# Patient Record
Sex: Male | Born: 1975 | Race: Black or African American | Hispanic: No | Marital: Married | Smoking: Never smoker
Health system: Southern US, Community
[De-identification: ages and names within clinical notes are randomized; demographics above are authoritative.]

## PROBLEM LIST (undated history)

## (undated) DIAGNOSIS — G4733 Obstructive sleep apnea (adult) (pediatric): Secondary | ICD-10-CM

## (undated) DIAGNOSIS — K760 Fatty (change of) liver, not elsewhere classified: Secondary | ICD-10-CM

## (undated) DIAGNOSIS — K219 Gastro-esophageal reflux disease without esophagitis: Secondary | ICD-10-CM

## (undated) DIAGNOSIS — K449 Diaphragmatic hernia without obstruction or gangrene: Secondary | ICD-10-CM

## (undated) HISTORY — DX: Diaphragmatic hernia without obstruction or gangrene: K44.9

## (undated) HISTORY — DX: Obstructive sleep apnea (adult) (pediatric): G47.33

## (undated) HISTORY — DX: Gastro-esophageal reflux disease without esophagitis: K21.9

## (undated) HISTORY — PX: OTHER SURGICAL HISTORY: SHX169

## (undated) HISTORY — DX: Fatty (change of) liver, not elsewhere classified: K76.0

---

## 2012-07-16 ENCOUNTER — Other Ambulatory Visit: Payer: Self-pay | Admitting: Internal Medicine

## 2012-07-16 LAB — BASIC METABOLIC PANEL
Anion Gap: 9 (ref 7–16)
BUN: 13 mg/dL (ref 7–18)
Calcium, Total: 8.8 mg/dL (ref 8.5–10.1)
Chloride: 111 mmol/L — ABNORMAL HIGH (ref 98–107)
Co2: 23 mmol/L (ref 21–32)
EGFR (African American): >60
EGFR (Non-African Amer.): >60

## 2012-07-16 LAB — URINALYSIS, COMPLETE
Bacteria: NONE SEEN
Glucose,UR: NEGATIVE mg/dL (ref 0–75)
Leukocyte Esterase: NEGATIVE
Nitrite: NEGATIVE
Protein: NEGATIVE
RBC,UR: <1 /HPF (ref 0–5)
Specific Gravity: 1.021 (ref 1.003–1.030)
Squamous Epithelial: NONE SEEN
WBC UR: <1 /HPF (ref 0–5)

## 2012-07-16 LAB — HEPATIC FUNCTION PANEL A (ARMC)
Albumin: 4.1 g/dL (ref 3.4–5.0)
Alkaline Phosphatase: 87 U/L (ref 50–136)
Bilirubin, Direct: <0.1 mg/dL (ref 0.00–0.20)
Bilirubin,Total: 0.2 mg/dL (ref 0.2–1.0)
SGOT(AST): 37 U/L (ref 15–37)
SGPT (ALT): 91 U/L — ABNORMAL HIGH (ref 12–78)

## 2012-07-17 LAB — URINE CULTURE

## 2013-05-31 ENCOUNTER — Other Ambulatory Visit: Payer: Self-pay | Admitting: Internal Medicine

## 2013-05-31 LAB — HEPATIC FUNCTION PANEL A (ARMC)
Alkaline Phosphatase: 77 U/L (ref 50–136)
Bilirubin, Direct: 0.2 mg/dL (ref 0.00–0.20)
SGOT(AST): 32 U/L (ref 15–37)

## 2014-06-12 ENCOUNTER — Telehealth: Payer: Self-pay | Admitting: Internal Medicine

## 2014-06-12 ENCOUNTER — Ambulatory Visit (HOSPITAL_COMMUNITY)
Admission: RE | Admit: 2014-06-12 | Discharge: 2014-06-12 | Disposition: A | Discharge status: Home / Self Care | Payer: 59 | Source: Ambulatory Visit | Attending: Internal Medicine | Admitting: Internal Medicine

## 2014-06-12 ENCOUNTER — Ambulatory Visit: Payer: 59 | Attending: Internal Medicine | Admitting: Internal Medicine

## 2014-06-12 DIAGNOSIS — M545 Low back pain: Secondary | ICD-10-CM | POA: Insufficient documentation

## 2014-06-12 DIAGNOSIS — M5441 Lumbago with sciatica, right side: Secondary | ICD-10-CM

## 2014-06-12 LAB — CBC WITH DIFFERENTIAL/PLATELET
BASOS PCT: 1 % (ref 0–1)
Basophils Absolute: 0.1 10*3/uL (ref 0.0–0.1)
EOS ABS: 0.1 10*3/uL (ref 0.0–0.7)
Eosinophils Relative: 2 % (ref 0–5)
HCT: 43.6 % (ref 39.0–52.0)
HEMOGLOBIN: 15.9 g/dL (ref 13.0–17.0)
Lymphocytes Relative: 31 % (ref 12–46)
Lymphs Abs: 2.3 10*3/uL (ref 0.7–4.0)
MCH: 29.8 pg (ref 26.0–34.0)
MCHC: 36.5 g/dL — AB (ref 30.0–36.0)
MCV: 81.8 fL (ref 78.0–100.0)
MONO ABS: 0.4 10*3/uL (ref 0.1–1.0)
MONOS PCT: 6 % (ref 3–12)
MPV: 10.9 fL (ref 9.4–12.4)
NEUTROS PCT: 60 % (ref 43–77)
Neutro Abs: 4.4 10*3/uL (ref 1.7–7.7)
Platelets: 227 10*3/uL (ref 150–400)
RBC: 5.33 MIL/uL (ref 4.22–5.81)
RDW: 13.8 % (ref 11.5–15.5)
WBC: 7.4 10*3/uL (ref 4.0–10.5)

## 2014-06-12 LAB — LIPID PANEL
CHOLESTEROL: 187 mg/dL (ref 0–200)
HDL: 43 mg/dL (ref >39)
LDL Cholesterol: 121 mg/dL — ABNORMAL HIGH (ref 0–99)
TRIGLYCERIDES: 116 mg/dL (ref <150)
Total CHOL/HDL Ratio: 4.3 Ratio
VLDL: 23 mg/dL (ref 0–40)

## 2014-06-12 LAB — COMPLETE METABOLIC PANEL WITH GFR
ALT: 44 U/L (ref 0–53)
AST: 25 U/L (ref 0–37)
Albumin: 4.5 g/dL (ref 3.5–5.2)
Alkaline Phosphatase: 66 U/L (ref 39–117)
BILIRUBIN TOTAL: 0.7 mg/dL (ref 0.2–1.2)
BUN: 14 mg/dL (ref 6–23)
CO2: 31 mEq/L (ref 19–32)
CREATININE: 0.84 mg/dL (ref 0.50–1.35)
Calcium: 10 mg/dL (ref 8.4–10.5)
Chloride: 103 mEq/L (ref 96–112)
GFR, Est African American: >89 mL/min
GLUCOSE: 100 mg/dL — AB (ref 70–99)
Potassium: 4.9 mEq/L (ref 3.5–5.3)
Sodium: 142 mEq/L (ref 135–145)
Total Protein: 7.2 g/dL (ref 6.0–8.3)

## 2014-06-12 LAB — POCT GLYCOSYLATED HEMOGLOBIN (HGB A1C): HEMOGLOBIN A1C: 5.6

## 2014-06-12 LAB — TSH: TSH: 1.048 u[IU]/mL (ref 0.350–4.500)

## 2014-06-12 NOTE — Progress Notes (Signed)
Patient ID: Charles BienenstockDawood Bibb, male   DOB: 04/17/1976, 38 y.o.   MRN: [id number]   Charles BienenstockDawood Vosler, is a 38 y.o. male  WUJ:[id number]  NFA:[id number]  DOB - 11/29/1975  CC: No chief complaint on file.      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 He has no significant past medical history. Major complaint today is low back pain radiating to both hips and thighs for the past 4 months, worse in the last 2 weeks and start to wake him up from sleep. He refused his pain is about 3 out of 10 but quite intense at night. No known aggravating or relieving factors. He has no past history of trauma, or fall. He denies any fever or drenching night sweats. He does not smoke cigarettes, does not drink alcohol. No personal history of hypertension or diabetes. No significant family history. Not on any medication. Patient has No headache, No chest pain, No abdominal pain - No Nausea, No new weakness tingling or numbness, No Cough - SOB.  Allergies not on file No past medical history on file. No current outpatient prescriptions on file prior to visit.   No current facility-administered medications on file prior to visit.   No family history on file. History   Social History  . Marital Status: Married    Spouse Name: N/A    Number of Children: N/A  . Years of Education: N/A   Occupational History  . Not on file.   Social History Main Topics  . Smoking status: Not on file  . Smokeless tobacco: Not on file  . Alcohol Use: Not on file  . Drug Use: Not on file  . Sexual Activity: Not on file   Other Topics Concern  . Not on file   Social History Narrative  . No narrative on file    Review of Systems: BP 138/84 mm Hg, HR: 87 Constitutional: Negative for fever, chills, diaphoresis, activity change, appetite change and fatigue. HENT: Negative for ear pain, nosebleeds, congestion, facial swelling, rhinorrhea, neck pain, neck stiffness and ear  discharge.  Eyes: Negative for pain, discharge, redness, itching and visual disturbance. Respiratory: Negative for cough, choking, chest tightness, shortness of breath, wheezing and stridor.  Cardiovascular: Negative for chest pain, palpitations and leg swelling. Gastrointestinal: Negative for abdominal distention. Genitourinary: Negative for dysuria, urgency, frequency, hematuria, flank pain, decreased urine volume, difficulty urinating and dyspareunia.  Musculoskeletal: Positive for back pain, negative for joint swelling, arthralgia and gait problem. Neurological: Negative for dizziness, tremors, seizures, syncope, facial asymmetry, speech difficulty, weakness, light-headedness, numbness and headaches.  Hematological: Negative for adenopathy. Does not bruise/bleed easily. Psychiatric/Behavioral: Negative for hallucinations, behavioral problems, confusion, dysphoric mood, decreased concentration and agitation.    Objective:  There were no vitals filed for this visit.  Physical Exam: Constitutional: Patient appears well-developed and well-nourished. No distress. HENT: Normocephalic, atraumatic, External right and left ear normal. Oropharynx is clear and moist.  Eyes: Conjunctivae and EOM are normal. PERRLA, no scleral icterus. Neck: Normal ROM. Neck supple. No JVD. No tracheal deviation. No thyromegaly. CVS: RRR, S1/S2 +, no murmurs, no gallops, no carotid bruit.  Pulmonary: Effort and breath sounds normal, no stridor, rhonchi, wheezes, rales.  Abdominal: Soft. BS +, no distension, tenderness, rebound or guarding.  Musculoskeletal: Normal range of motion. No edema and no tenderness.  Lymphadenopathy: No lymphadenopathy noted, cervical, inguinal or axillary Neuro: Alert. Normal reflexes, muscle tone coordination. No cranial nerve deficit. Skin: Skin is warm  and dry. No rash noted. Not diaphoretic. No erythema. No pallor. Psychiatric: Normal mood and affect. Behavior, judgment, thought  content normal.  No results found for: WBC, HGB, HCT, MCV, PLT No results found for: CREATININE, BUN, NA, K, CL, CO2  No results found for: HGBA1C Lipid Panel  No results found for: CHOL, TRIG, HDL, CHOLHDL, VLDL, LDLCALC     Assessment and plan:   1. Midline low back pain with right-sided sciatica  - DG Lumbar Spine Complete; Future - DG Hip Bilateral W/Pelvis; Future - CBC with Differential - COMPLETE METABOLIC PANEL WITH GFR - POCT glycosylated hemoglobin (Hb A1C) - Lipid panel - TSH - Urinalysis, Complete   Lumbar x-ray report shows: IMPRESSION: 1. 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 The imaging appearance on these radiographs is nonspecific and ranges from aneurysmal bone cyst to telangiectatic osteosarcoma. Lytic metastasis, myeloma or lymphoma are also in the differential considerations. Followup MRI of the pelvis with and without contrast is recommended. A noncontrast CT will probably also be useful but can be obtained after MRI. 2. Normal appearance of the lumbar spine.  An MRI pelvis with and without contrast has been ordered, subsequent follow up will depend on results of MRI.  Patient was extensively counseled about nutrition and exercise.  Return in about 6 months (around 12/11/2014), or if symptoms worsen or fail to improve, for Follow up Pain and comorbidities.  The patient was given clear instructions to go to ER or return to medical center if symptoms don't improve, worsen or new problems develop. The patient verbalized understanding. The patient was told to call to get lab results if they haven't heard anything in the next week.     This note has been created with Education officer, environmentalDragon speech recognition software and smart phrase technology. Any transcriptional errors are unintentional.    Jeanann LewandowskyJEGEDE, Raynesha Tiedt, MD, MHA, FACP, FAAP [facility] [place], [place] [phone]   06/12/2014, 10:51 AM

## 2014-06-12 NOTE — Telephone Encounter (Signed)
[facility] needing an "MRI Pelvis with out and with" order to be placed.

## 2014-06-12 NOTE — Patient Instructions (Signed)
Back Pain, Adult Low back pain is very common. About 1 in 5 people have back pain.The cause of low back pain is rarely dangerous. The pain often gets better over time.About half of people with a sudden onset of back pain feel better in just 2 weeks. About 8 in 10 people feel better by 6 weeks.  CAUSES Some common causes of back pain include:  Strain of the muscles or ligaments supporting the spine.  Wear and tear (degeneration) of the spinal discs.  Arthritis.  Direct injury to the back. DIAGNOSIS Most of the time, the direct cause of low back pain is not known.However, back pain can be treated effectively even when the exact cause of the pain is unknown.Answering your caregiver's questions about your overall health and symptoms is one of the most accurate ways to make sure the cause of your pain is not dangerous. If your caregiver needs more information, he or she may order lab work or imaging tests (X-rays or MRIs).However, even if imaging tests show changes in your back, this usually does not require surgery. HOME CARE INSTRUCTIONS For many people, back pain returns.Since low back pain is rarely dangerous, it is often a condition that people can learn to manageon their own.   Remain active. It is stressful on the back to sit or stand in one place. Do not sit, drive, or stand in one place for more than 30 minutes at a time. Take short walks on level surfaces as soon as pain allows.Try to increase the length of time you walk each day.  Do not stay in bed.Resting more than 1 or 2 days can delay your recovery.  Do not avoid exercise or work.Your body is made to move.It is not dangerous to be active, even though your back may hurt.Your back will likely heal faster if you return to being active before your pain is gone.  Pay attention to your body when you bend and lift. Many people have less discomfortwhen lifting if they bend their knees, keep the load close to their bodies,and  avoid twisting. Often, the most comfortable positions are those that put less stress on your recovering back.  Find a comfortable position to sleep. Use a firm mattress and lie on your side with your knees slightly bent. If you lie on your back, put a pillow under your knees.  Only take over-the-counter or prescription medicines as directed by your caregiver. Over-the-counter medicines to reduce pain and inflammation are often the most helpful.Your caregiver may prescribe muscle relaxant drugs.These medicines help dull your pain so you can more quickly return to your normal activities and healthy exercise.  Put ice on the injured area.  Put ice in a plastic bag.  Place a towel between your skin and the bag.  Leave the ice on for 15-20 minutes, 03-04 times a day for the first 2 to 3 days. After that, ice and heat may be alternated to reduce pain and spasms.  Ask your caregiver about trying back exercises and gentle massage. This may be of some benefit.  Avoid feeling anxious or stressed.Stress increases muscle tension and can worsen back pain.It is important to recognize when you are anxious or stressed and learn ways to manage it.Exercise is a great option. SEEK MEDICAL CARE IF:  You have pain that is not relieved with rest or medicine.  You have pain that does not improve in 1 week.  You have new symptoms.  You are generally not feeling well. SEEK   IMMEDIATE MEDICAL CARE IF:   You have pain that radiates from your back into your legs.  You develop new bowel or bladder control problems.  You have unusual weakness or numbness in your arms or legs.  You develop nausea or vomiting.  You develop abdominal pain.  You feel faint. Document Released: 07/07/2005 Document Revised: 01/06/2012 Document Reviewed: 11/08/2013 ExitCare Patient Information 2015 [employer]. This information is not intended to replace advice given to you by your health care provider. Make sure you  discuss any questions you have with your health care provider.  

## 2014-06-13 ENCOUNTER — Ambulatory Visit
Admission: RE | Admit: 2014-06-13 | Discharge: 2014-06-13 | Disposition: A | Discharge status: Home / Self Care | Payer: 59 | Source: Ambulatory Visit | Attending: Internal Medicine | Admitting: Internal Medicine

## 2014-06-13 DIAGNOSIS — M5441 Lumbago with sciatica, right side: Secondary | ICD-10-CM

## 2014-06-13 LAB — URINALYSIS, COMPLETE
BILIRUBIN URINE: NEGATIVE
Bacteria, UA: NONE SEEN
Casts: NONE SEEN
Crystals: NONE SEEN
GLUCOSE, UA: NEGATIVE mg/dL
Hgb urine dipstick: NEGATIVE
Ketones, ur: NEGATIVE mg/dL
Leukocytes, UA: NEGATIVE
Nitrite: NEGATIVE
Protein, ur: NEGATIVE mg/dL
SPECIFIC GRAVITY, URINE: 1.021 (ref 1.005–1.030)
SQUAMOUS EPITHELIAL / LPF: NONE SEEN
UROBILINOGEN UA: 0.2 mg/dL (ref 0.0–1.0)
pH: 6.5 (ref 5.0–8.0)

## 2014-06-13 MED ORDER — GADOBENATE DIMEGLUMINE 529 MG/ML IV SOLN
20.0000 mL | Freq: Once | INTRAVENOUS | Status: AC | PRN
  Administered 2014-06-13: 20 mL via INTRAVENOUS

## 2014-06-19 ENCOUNTER — Other Ambulatory Visit (HOSPITAL_COMMUNITY): Payer: Self-pay | Admitting: Orthopedic Surgery

## 2014-06-19 ENCOUNTER — Ambulatory Visit (HOSPITAL_COMMUNITY)
Admission: RE | Admit: 2014-06-19 | Discharge: 2014-06-19 | Disposition: A | Discharge status: Home / Self Care | Payer: 59 | Source: Ambulatory Visit | Attending: Orthopedic Surgery | Admitting: Orthopedic Surgery

## 2014-06-19 DIAGNOSIS — M898X5 Other specified disorders of bone, thigh: Secondary | ICD-10-CM

## 2014-06-19 DIAGNOSIS — M898X8 Other specified disorders of bone, other site: Secondary | ICD-10-CM | POA: Insufficient documentation

## 2014-06-19 MED ORDER — TECHNETIUM TC 99M MEDRONATE IV KIT
25.0000 | PACK | Freq: Once | INTRAVENOUS | Status: AC | PRN
  Administered 2014-06-19: 25 via INTRAVENOUS

## 2014-06-22 ENCOUNTER — Ambulatory Visit (HOSPITAL_COMMUNITY): Payer: 59

## 2014-06-27 ENCOUNTER — Telehealth: Payer: Self-pay | Admitting: Emergency Medicine

## 2014-06-27 NOTE — Telephone Encounter (Signed)
Pt already aware of MRI results

## 2014-06-27 NOTE — Telephone Encounter (Signed)
-----   Message from Quentin Angstlugbemiga E Jegede, MD sent at 06/13/2014 11:35 AM EST ----- Patient has been given the result of MRI, he will be referred to orthopedic surgeon for further evaluation and possible management.

## 2014-07-27 ENCOUNTER — Ambulatory Visit: Payer: 59 | Attending: Internal Medicine | Admitting: Internal Medicine

## 2014-07-27 DIAGNOSIS — R110 Nausea: Secondary | ICD-10-CM

## 2014-07-27 MED ORDER — PANTOPRAZOLE SODIUM 40 MG PO TBEC: 40.0000 mg | DELAYED_RELEASE_TABLET | Freq: Every day | ORAL | Status: DC

## 2014-07-27 MED ORDER — ONDANSETRON HCL 4 MG PO TABS: 4.0000 mg | ORAL_TABLET | Freq: Three times a day (TID) | ORAL | Status: DC | PRN

## 2014-07-27 NOTE — Patient Instructions (Signed)

## 2014-07-27 NOTE — Progress Notes (Signed)
Patient ID: Charles David, male   DOB: 05/29/1976, 39 y.o.   MRN: [id number]   Charles David, is a 39 y.o. male  WUJ:[id number]  NFA:[id number]  DOB - 12/18/1975  No chief complaint on file.       Subjective:   Charles David is a 39 y.o. male here today for a follow up visit. Patient has no significant past medical history, recently investigated for low back pain, initial x-ray showed expansile cystic lesion in the medial right iliac bone, patient has probable pelvic MRI, bone scan and biopsy, he has been advised close follow-up as no significant finding so far. Patient is here today with complaint of abdominal pain, intense nausea but no vomiting. He requests abdominal ultrasound to evaluate for gallstone. No change in bowel habit. No abdominal distention. Patient has No headache, No chest pain, No new weakness tingling or numbness, No Cough - SOB.  Problem  Nausea Without Vomiting    ALLERGIES: Allergies not on file  PAST MEDICAL HISTORY: No past medical history on file.  MEDICATIONS AT HOME: Prior to Admission medications   Medication Sig Start Date End Date Taking? Authorizing Provider  ondansetron (ZOFRAN) 4 MG tablet Take 1 tablet (4 mg total) by mouth every 8 (eight) hours as needed for nausea or vomiting. 07/27/14   Quentin Angstlugbemiga E Lashonta Pilling, MD  pantoprazole (PROTONIX) 40 MG tablet Take 1 tablet (40 mg total) by mouth daily. 07/27/14   Quentin Angstlugbemiga E Rayen Dafoe, MD     Objective:   There were no vitals filed for this visit.  Exam General appearance : Awake, alert, not in any distress. Speech Clear. Not toxic looking HEENT: Atraumatic and Normocephalic, pupils equally reactive to light and accomodation Neck: supple, no JVD. No cervical lymphadenopathy.  Chest:Good air entry bilaterally, no added sounds  CVS: S1 S2 regular, no murmurs.  Abdomen: Bowel sounds present, Non tender and not distended with no gaurding, rigidity or rebound. Extremities: B/L Lower Ext shows no  edema, both legs are warm to touch Neurology: Awake alert, and oriented X 3, CN II-XII intact, Non focal Skin:No Rash Wounds:N/A  Data Review Lab Results  Component Value Date   HGBA1C 5.6 06/12/2014     Assessment & Plan   1. Nausea without vomiting  - US Abdomen Limited RUQ; Future  - ondansetron (ZOFRAN) 4 MG tablet; Take 1 tablet (4 mg total) by mouth every 8 (eight) hours as needed for nausea or vomiting.  Dispense: 30 tablet; Refill: 0 - pantoprazole (PROTONIX) 40 MG tablet; Take 1 tablet (40 mg total) by mouth daily.  Dispense: 90 tablet; Refill: 3  Patient was counseled extensively on nutrition and exercise.  Return in about 6 months (around 01/25/2015), or if symptoms worsen or fail to improve, for Follow up Pain and comorbidities.  The patient was given clear instructions to go to ER or return to medical center if symptoms don't improve, worsen or new problems develop. The patient verbalized understanding. The patient was told to call to get lab results if they haven't heard anything in the next week.   This note has been created with Education officer, environmentalDragon speech recognition software and smart phrase technology. Any transcriptional errors are unintentional.    Jeanann LewandowskyJEGEDE, Aariana Shankland, MD, MHA, FACP, FAAP [facility] [place], [place] [phone]   07/27/2014, 4:22 PM

## 2014-07-28 ENCOUNTER — Other Ambulatory Visit: Payer: Self-pay | Admitting: Pulmonary Disease

## 2014-07-28 ENCOUNTER — Encounter: Payer: Self-pay | Admitting: Pulmonary Disease

## 2014-07-28 DIAGNOSIS — R0683 Snoring: Secondary | ICD-10-CM | POA: Insufficient documentation

## 2014-07-28 NOTE — Progress Notes (Signed)
Chief Complaint  Patient presents with  . Sleeping Problem    History of Present Illness: Charles BienenstockDawood David is a 39 y.o. male for evaluation of sleep problems.  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  He has noticed feeling more sleepy during the day.  He has been told that he snores, and this has been getting worse.  His wife has also told him that he stops breathing while asleep.  He will wake up frequently feeling like he can't catch his breath, and this is worse when he sleeps on his back.  He goes to sleep at 10 pm.  He falls asleep quickly.  He wakes up occasionally to use the bathroom.  He gets out of bed at 6 am.  He feels tire in the morning.  He denies morning headache.  He does not use anything to help him fall sleep or stay awake.  He denies sleep walking, sleep talking, bruxism, or nightmares.  There is no history of restless legs.  He denies sleep hallucinations, sleep paralysis, or cataplexy.  The Epworth score is 14 out of 24.  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 Authorizing Provider  ondansetron (ZOFRAN) 4 MG tablet Take 1 tablet (4 mg total) by mouth every 8 (eight) hours as needed for nausea or vomiting. 07/27/14   Quentin Angstlugbemiga E Jegede, MD  pantoprazole (PROTONIX) 40 MG tablet Take 1 tablet (40 mg total) by mouth daily. 07/27/14   Quentin Angstlugbemiga E Jegede, MD    No Known Allergies  His denies significant family history.  He  reports that he has never smoked. He does not have any smokeless tobacco history on file. He reports that he does not drink alcohol.   Physical Exam: Blood pressure 135/85, pulse 95, temperature 98.7 F (37.1 C), temperature source Axillary, resp. rate 14, height 6\' 1"  (1.854 m), weight 300 lb (136.079 kg), SpO2 98 %. Body mass index is 39.59 kg/(m^2).  General - No distress ENT - No sinus tenderness, no oral exudate,  no LAN, no thyromegaly, TM clear, pupils equal/reactive, MP 3, enlarged tongue Cardiac - s1s2 regular, no murmur, pulses symmetric Chest - No wheeze/rales/dullness, good air entry, normal respiratory excursion Back - No focal tenderness Abd - Soft, non-tender, no organomegaly, + bowel sounds Ext - No edema Neuro - Normal strength, cranial nerves intact Skin - No rashes Psych - Normal mood, and behavior  Impression:  Snoring. Discussion: He reports snoring, sleep disruption, witnessed apnea, and daytime sleepiness.  I am concerned he coulud have sleep apnea.  His BMI is > 35.  We discussed how sleep apnea can affect various health problems including risks for hypertension, cardiovascular disease, and diabetes.  We also discussed how sleep disruption can increase risks for accident, such as while driving.  Weight loss as a means of improving sleep apnea was also reviewed.  Additional treatment options discussed were CPAP therapy, oral appliance, and surgical intervention. Plan: - will arrange for home sleep study to further assess for obstructive sleep apnea     Coralyn HellingVineet Karri Kallenbach, M.D. Pager [phone]

## 2014-07-28 NOTE — Patient Instructions (Signed)
Will arrange for home sleep study and call with results.

## 2014-07-31 ENCOUNTER — Ambulatory Visit (HOSPITAL_COMMUNITY)
Admission: RE | Admit: 2014-07-31 | Discharge: 2014-07-31 | Disposition: A | Discharge status: Home / Self Care | Payer: 59 | Source: Ambulatory Visit | Attending: Internal Medicine | Admitting: Internal Medicine

## 2014-07-31 DIAGNOSIS — R110 Nausea: Secondary | ICD-10-CM

## 2014-07-31 DIAGNOSIS — R112 Nausea with vomiting, unspecified: Secondary | ICD-10-CM | POA: Insufficient documentation

## 2014-08-02 ENCOUNTER — Ambulatory Visit (HOSPITAL_BASED_OUTPATIENT_CLINIC_OR_DEPARTMENT_OTHER): Payer: 59 | Attending: Pulmonary Disease | Admitting: *Deleted

## 2014-08-02 DIAGNOSIS — R0683 Snoring: Secondary | ICD-10-CM | POA: Diagnosis not present

## 2014-08-06 NOTE — Sleep Study (Signed)
[facility]  NAME: Charles David DATE OF BIRTH:  04/08/1976 MEDICAL RECORD NUMBER [id number]  LOCATION: [facility]  PHYSICIAN: Coralyn HellingVineet Drisana Schweickert, M.D. DATE OF STUDY: 08/02/2014  SLEEP STUDY TYPE: Home sleep study               REFERRING PHYSICIAN: Coralyn HellingSood, Geddy Boy[place]un, MD  INDICATION FOR STUDY:  Huey BienenstockDawood Moreland is a 39 y.o. male who presents to the sleep lab for evaluation of hypersomnia with obstructive sleep apnea.  He reports snoring, sleep disruption, apnea, and daytime sleepiness.  EPWORTH SLEEPINESS SCORE: 8. HEIGHT: 6\' 1"  WEIGHT: 300    BMI: 39.6   MEDICATIONS:  Current Outpatient Prescriptions on File Prior to Visit  Medication Sig Dispense Refill  . ondansetron (ZOFRAN) 4 MG tablet Take 1 tablet (4 mg total) by mouth every 8 (eight) hours as needed for nausea or vomiting. 30 tablet 0  . pantoprazole (PROTONIX) 40 MG tablet Take 1 tablet (40 mg total) by mouth daily. 90 tablet 3   No current facility-administered medications on file prior to visit.   Total recording time: 400.8 minutes  CARDIAC DATA:  Average heart rate: 82 beats per minute. Rhythm strip: sinus rhythm with brief period of sinus tachycardia.  RESPIRATORY DATA: Snoring: loud. Average AHI: 8.5.   Hypopnea index: 7.8. Obstructive apnea index: 0.6.  Central apnea index: 0.1.  Mixed apnea index: 0.  OXYGEN DATA:  Baseline oxygenation: 98%. Lowest SaO2: 90%. Time spent below SaO2 90%: 0 minutes.  IMPRESSION/ RECOMMENDATION:   This study shows mild sleep apnea with an AHI of 8.5 and SaO2 low of 90%.   Additional therapies include weight loss, CPAP, oral appliance, or surgical evaluation.   Coralyn HellingVineet Tayleigh Wetherell, M.D. Diplomate, Biomedical engineerAmerican Board of Sleep Medicine  ELECTRONICALLY SIGNED ON:  08/06/2014, 11:31 AM [facility] PH: [phone]   FX: [phone] ACCREDITED BY THE AMERICAN ACADEMY OF SLEEP MEDICINE

## 2014-08-09 ENCOUNTER — Telehealth: Payer: Self-pay | Admitting: Pulmonary Disease

## 2014-08-09 DIAGNOSIS — R0683 Snoring: Secondary | ICD-10-CM

## 2014-08-09 NOTE — Telephone Encounter (Signed)
HST 08/04/14 >> AHI 8.5.  D/w Dr. Payton MccallumEl gergawy.  Will have my nurse arrange for auto CPAP set up.  Please arrange for auto CPAP with range 5 to 15 cm H2O, heated humidity and mask of choice.  Please have download sent after 1 month of use.

## 2014-08-10 ENCOUNTER — Telehealth: Payer: Self-pay | Admitting: Emergency Medicine

## 2014-08-10 NOTE — Telephone Encounter (Signed)
Results have been explained to patient, pt expressed understanding. Order placed. Nothing further needed.  

## 2014-08-10 NOTE — Telephone Encounter (Signed)
Pt given ultrasound results with instructions to start regular exercise with low fat diet

## 2014-08-10 NOTE — Telephone Encounter (Signed)
-----   Message from Quentin Angstlugbemiga E Jegede, MD sent at 08/10/2014  9:15 AM EST ----- Please inform patient that his abdominal ultrasound shows evidence of fatty liver but no obvious mass, no gallstone detected. Advised patient to engage in regular exercise, stay away from alcohol and advised on low-cholesterol low-fat diet.

## 2014-08-24 ENCOUNTER — Other Ambulatory Visit (HOSPITAL_COMMUNITY): Payer: Self-pay | Admitting: Orthopedic Surgery

## 2014-08-24 DIAGNOSIS — M899 Disorder of bone, unspecified: Secondary | ICD-10-CM

## 2014-09-15 LAB — HM COLONOSCOPY

## 2014-09-18 ENCOUNTER — Ambulatory Visit (HOSPITAL_COMMUNITY)
Admission: RE | Admit: 2014-09-18 | Discharge: 2014-09-18 | Disposition: A | Discharge status: Home / Self Care | Payer: 59 | Source: Ambulatory Visit | Attending: Orthopedic Surgery | Admitting: Orthopedic Surgery

## 2014-09-18 ENCOUNTER — Other Ambulatory Visit: Payer: Self-pay | Admitting: Gastroenterology

## 2014-09-18 DIAGNOSIS — M899 Disorder of bone, unspecified: Secondary | ICD-10-CM | POA: Diagnosis present

## 2014-09-18 DIAGNOSIS — R110 Nausea: Secondary | ICD-10-CM

## 2014-09-18 MED ORDER — GADOBENATE DIMEGLUMINE 529 MG/ML IV SOLN
20.0000 mL | Freq: Once | INTRAVENOUS | Status: AC | PRN
  Administered 2014-09-18: 15 mL via INTRAVENOUS

## 2014-09-21 ENCOUNTER — Telehealth: Payer: Self-pay | Admitting: Pulmonary Disease

## 2014-09-21 NOTE — Telephone Encounter (Signed)
Auto CPAP 08/18/14 to 09/16/14 >> used on 9 of 30 nights with average 2 hrs and 5 min.  Average AHI is 0.2 with median CPAP 6 cm H2O and 95 th percentile CPAP 7 cm H20.   Will have my nurse inform Dr. Randol KernElgergawy that his CPAP download shows good control of sleep apnea when he is using his CPAP.

## 2014-09-21 NOTE — Telephone Encounter (Signed)
Results have been explained to patient, pt expressed understanding. Nothing further needed.  

## 2014-09-28 ENCOUNTER — Ambulatory Visit (HOSPITAL_COMMUNITY): Payer: 59

## 2014-10-11 ENCOUNTER — Ambulatory Visit (HOSPITAL_COMMUNITY)
Admission: RE | Admit: 2014-10-11 | Discharge: 2014-10-11 | Disposition: A | Discharge status: Home / Self Care | Payer: 59 | Source: Ambulatory Visit | Attending: Gastroenterology | Admitting: Gastroenterology

## 2014-10-11 ENCOUNTER — Encounter (HOSPITAL_COMMUNITY): Payer: Self-pay

## 2014-10-11 DIAGNOSIS — R110 Nausea: Secondary | ICD-10-CM | POA: Insufficient documentation

## 2014-10-11 MED ORDER — SINCALIDE 5 MCG IJ SOLR
0.0200 ug/kg | Freq: Once | INTRAMUSCULAR | Status: AC
  Administered 2014-10-11: 2 ug via INTRAVENOUS

## 2014-10-11 MED ORDER — TECHNETIUM TC 99M MEBROFENIN IV KIT
5.0000 | PACK | Freq: Once | INTRAVENOUS | Status: AC | PRN
  Administered 2014-10-11: 5 via INTRAVENOUS

## 2014-10-26 ENCOUNTER — Other Ambulatory Visit: Payer: Self-pay | Admitting: Gastroenterology

## 2014-10-26 DIAGNOSIS — R1084 Generalized abdominal pain: Secondary | ICD-10-CM

## 2014-10-26 DIAGNOSIS — R110 Nausea: Secondary | ICD-10-CM

## 2014-10-31 ENCOUNTER — Encounter (HOSPITAL_COMMUNITY): Payer: Self-pay

## 2014-10-31 ENCOUNTER — Ambulatory Visit (HOSPITAL_COMMUNITY)
Admission: RE | Admit: 2014-10-31 | Discharge: 2014-10-31 | Disposition: A | Discharge status: Home / Self Care | Payer: 59 | Source: Ambulatory Visit | Attending: Gastroenterology | Admitting: Gastroenterology

## 2014-10-31 DIAGNOSIS — R112 Nausea with vomiting, unspecified: Secondary | ICD-10-CM | POA: Diagnosis not present

## 2014-10-31 DIAGNOSIS — M899 Disorder of bone, unspecified: Secondary | ICD-10-CM | POA: Diagnosis not present

## 2014-10-31 DIAGNOSIS — K760 Fatty (change of) liver, not elsewhere classified: Secondary | ICD-10-CM | POA: Insufficient documentation

## 2014-10-31 DIAGNOSIS — R1084 Generalized abdominal pain: Secondary | ICD-10-CM | POA: Diagnosis not present

## 2014-10-31 DIAGNOSIS — R110 Nausea: Secondary | ICD-10-CM

## 2014-10-31 DIAGNOSIS — K449 Diaphragmatic hernia without obstruction or gangrene: Secondary | ICD-10-CM | POA: Diagnosis not present

## 2014-10-31 MED ORDER — IOHEXOL 300 MG/ML  SOLN
100.0000 mL | Freq: Once | INTRAMUSCULAR | Status: AC | PRN
  Administered 2014-10-31: 100 mL via INTRAVENOUS

## 2015-01-01 ENCOUNTER — Ambulatory Visit
Admission: RE | Admit: 2015-01-01 | Discharge: 2015-01-01 | Disposition: A | Discharge status: Home / Self Care | Payer: 59 | Source: Ambulatory Visit | Attending: Internal Medicine | Admitting: Internal Medicine

## 2015-01-01 ENCOUNTER — Encounter: Payer: Self-pay | Admitting: Internal Medicine

## 2015-01-01 ENCOUNTER — Ambulatory Visit (INDEPENDENT_AMBULATORY_CARE_PROVIDER_SITE_OTHER): Payer: 59 | Admitting: Internal Medicine

## 2015-01-01 VITALS — BP 150/95 | HR 84 | Temp 98.0°F | Wt 290.2 lb

## 2015-01-01 DIAGNOSIS — R05 Cough: Secondary | ICD-10-CM | POA: Diagnosis not present

## 2015-01-01 DIAGNOSIS — R059 Cough, unspecified: Secondary | ICD-10-CM | POA: Insufficient documentation

## 2015-01-01 DIAGNOSIS — R21 Rash and other nonspecific skin eruption: Secondary | ICD-10-CM | POA: Diagnosis not present

## 2015-01-01 DIAGNOSIS — G4733 Obstructive sleep apnea (adult) (pediatric): Secondary | ICD-10-CM | POA: Insufficient documentation

## 2015-01-01 LAB — COMPREHENSIVE METABOLIC PANEL
ALT: 102 U/L — ABNORMAL HIGH (ref 0–53)
AST: 63 U/L — ABNORMAL HIGH (ref 0–37)
Albumin: 4.4 g/dL (ref 3.5–5.2)
Alkaline Phosphatase: 81 U/L (ref 39–117)
BILIRUBIN TOTAL: 0.9 mg/dL (ref 0.2–1.2)
BUN: 13 mg/dL (ref 6–23)
CO2: 28 meq/L (ref 19–32)
Calcium: 9.6 mg/dL (ref 8.4–10.5)
Chloride: 103 mEq/L (ref 96–112)
Creat: 0.91 mg/dL (ref 0.50–1.35)
Glucose, Bld: 96 mg/dL (ref 70–99)
Potassium: 4.5 mEq/L (ref 3.5–5.3)
SODIUM: 140 meq/L (ref 135–145)
TOTAL PROTEIN: 7.2 g/dL (ref 6.0–8.3)

## 2015-01-01 MED ORDER — DOXYCYCLINE HYCLATE 50 MG PO CAPS: 50.0000 mg | ORAL_CAPSULE | Freq: Two times a day (BID) | ORAL | Status: DC

## 2015-01-01 NOTE — Progress Notes (Signed)
Patient ID: Charles David, male   DOB: Dec 07, 1975, 39 y.o.   MRN: [id number]         [facility] for Infectious Disease  Patient Active Problem List   Diagnosis Date Noted  . Rash 01/01/2015    Priority: High  . Cough 01/01/2015    Priority: High  . Hypersomnia with sleep apnea 01/01/2015  . Nausea without vomiting 07/27/2014  . Midline low back pain with right-sided sciatica 06/12/2014    Patient's Medications  New Prescriptions   DOXYCYCLINE (VIBRAMYCIN) 50 MG CAPSULE    Take 1 capsule (50 mg total) by mouth 2 (two) times daily.  Previous Medications   ONDANSETRON (ZOFRAN) 4 MG TABLET    Take 1 tablet (4 mg total) by mouth every 8 (eight) hours as needed for nausea or vomiting.   PANTOPRAZOLE (PROTONIX) 40 MG TABLET    Take 1 tablet (40 mg total) by mouth daily.  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 He returned from a trip to [place] 3 weeks ago. About 10 days ago he developed fatigue and low-grade fevers up to 99. 5 days ago he developed a rash on his thighs. He is also developed some dry cough and mild shortness of breath. The rash is only mildly pruritic. He has had no sick contacts that he is aware of. He's had no tick exposure or animal exposure.  Review of Systems: Constitutional: positive for fatigue and fevers, negative for anorexia, chills, sweats and weight loss Eyes: negative Ears, nose, mouth, throat, and face: negative Respiratory: positive for cough and shortness of breath, negative for pleurisy/chest pain, sputum and wheezing Cardiovascular: negative Gastrointestinal: negative Genitourinary:negative  No past medical history on file.  History  Substance Use Topics  . Smoking status: Never Smoker   . Smokeless tobacco: Not on file  . Alcohol Use: No    No family history on file.  No Known  Allergies  Objective: Temp: 98 F (36.7 C) (06/13 1145) Temp Source: Oral (06/13 1145) BP: 150/95 mmHg (06/13 1145) Pulse Rate: 84 (06/13 1145)  General: He is smiling and in no distress Skin: Erythematous maculopapular rash from his groin to his knees bilaterally. 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 He has not been in any hot tubs recently. I'm not sure how his rash correlates with the low-grade fevers and cough. I will treat him with doxycycline.  Plan: 1. Doxycycline 100 mg twice daily for 7 days 2. CBC with differential and complete metabolic panel 3. Chest x-ray   Cliffton Asters, MD [facility]ious [facility] [phone] pager   [phone] cell 01/01/2015, 12:45 PM

## 2015-01-02 LAB — CBC WITH DIFFERENTIAL/PLATELET
Basophils Absolute: 0.2 10*3/uL — ABNORMAL HIGH (ref 0.0–0.1)
Basophils Relative: 3 % — ABNORMAL HIGH (ref 0–1)
Eosinophils Absolute: 0.1 10*3/uL (ref 0.0–0.7)
Eosinophils Relative: 2 % (ref 0–5)
HEMATOCRIT: 42.2 % (ref 39.0–52.0)
HEMOGLOBIN: 14.3 g/dL (ref 13.0–17.0)
LYMPHS ABS: 3.2 10*3/uL (ref 0.7–4.0)
Lymphocytes Relative: 46 % (ref 12–46)
MCH: 28.9 pg (ref 26.0–34.0)
MCHC: 33.9 g/dL (ref 30.0–36.0)
MCV: 85.3 fL (ref 78.0–100.0)
MONOS PCT: 11 % (ref 3–12)
MPV: 10.5 fL (ref 8.6–12.4)
Monocytes Absolute: 0.8 10*3/uL (ref 0.1–1.0)
NEUTROS ABS: 2.7 10*3/uL (ref 1.7–7.7)
NEUTROS PCT: 38 % — AB (ref 43–77)
Platelets: 184 10*3/uL (ref 150–400)
RBC: 4.95 MIL/uL (ref 4.22–5.81)
RDW: 13.7 % (ref 11.5–15.5)
WBC: 7 10*3/uL (ref 4.0–10.5)

## 2015-01-04 ENCOUNTER — Telehealth: Payer: Self-pay | Admitting: Internal Medicine

## 2015-01-04 NOTE — Telephone Encounter (Signed)
I spoke to him by phone this morning. He is feeling better since starting doxycycline. His fevers have resolved and his cough and rash are improving. He will finish up his doxycycline.

## 2015-04-13 ENCOUNTER — Other Ambulatory Visit (HOSPITAL_COMMUNITY): Payer: Self-pay | Admitting: Orthopedic Surgery

## 2015-04-13 DIAGNOSIS — M899 Disorder of bone, unspecified: Secondary | ICD-10-CM

## 2015-04-16 ENCOUNTER — Ambulatory Visit (HOSPITAL_COMMUNITY): Payer: 59

## 2015-04-23 ENCOUNTER — Other Ambulatory Visit (HOSPITAL_COMMUNITY)
Admission: RE | Admit: 2015-04-23 | Discharge: 2015-04-23 | Disposition: A | Discharge status: Home / Self Care | Payer: 59 | Source: Ambulatory Visit | Attending: Internal Medicine | Admitting: Internal Medicine

## 2015-04-23 DIAGNOSIS — Z029 Encounter for administrative examinations, unspecified: Secondary | ICD-10-CM | POA: Diagnosis present

## 2015-04-23 LAB — CBC
HEMATOCRIT: 42.5 % (ref 39.0–52.0)
HEMOGLOBIN: 14.5 g/dL (ref 13.0–17.0)
MCH: 28.5 pg (ref 26.0–34.0)
MCHC: 34.1 g/dL (ref 30.0–36.0)
MCV: 83.5 fL (ref 78.0–100.0)
Platelets: 202 10*3/uL (ref 150–400)
RBC: 5.09 MIL/uL (ref 4.22–5.81)
RDW: 12.9 % (ref 11.5–15.5)
WBC: 9.3 10*3/uL (ref 4.0–10.5)

## 2015-04-23 LAB — COMPREHENSIVE METABOLIC PANEL
ALK PHOS: 65 U/L (ref 38–126)
ALT: 26 U/L (ref 17–63)
ANION GAP: 9 (ref 5–15)
AST: 19 U/L (ref 15–41)
Albumin: 4.4 g/dL (ref 3.5–5.0)
BILIRUBIN TOTAL: 0.8 mg/dL (ref 0.3–1.2)
BUN: 21 mg/dL — ABNORMAL HIGH (ref 6–20)
CALCIUM: 9.4 mg/dL (ref 8.9–10.3)
CO2: 29 mmol/L (ref 22–32)
CREATININE: 1.11 mg/dL (ref 0.61–1.24)
Chloride: 100 mmol/L — ABNORMAL LOW (ref 101–111)
Glucose, Bld: 92 mg/dL (ref 65–99)
Potassium: 3.7 mmol/L (ref 3.5–5.1)
Sodium: 138 mmol/L (ref 135–145)
TOTAL PROTEIN: 7.2 g/dL (ref 6.5–8.1)

## 2015-04-23 LAB — C-REACTIVE PROTEIN: CRP: 0.8 mg/dL (ref <1.0)

## 2015-04-24 LAB — HEMOGLOBIN A1C
HEMOGLOBIN A1C: 5.9 % — AB (ref 4.8–5.6)
Mean Plasma Glucose: 123 mg/dL

## 2015-05-11 ENCOUNTER — Ambulatory Visit (HOSPITAL_COMMUNITY)
Admission: RE | Admit: 2015-05-11 | Discharge: 2015-05-11 | Disposition: A | Discharge status: Home / Self Care | Payer: 59 | Source: Ambulatory Visit | Attending: Orthopedic Surgery | Admitting: Orthopedic Surgery

## 2015-05-11 DIAGNOSIS — M899 Disorder of bone, unspecified: Secondary | ICD-10-CM | POA: Diagnosis not present

## 2015-05-11 MED ORDER — GADOBENATE DIMEGLUMINE 529 MG/ML IV SOLN
20.0000 mL | Freq: Once | INTRAVENOUS | Status: AC
  Administered 2015-05-11: 20 mL via INTRAVENOUS

## 2015-05-24 ENCOUNTER — Encounter: Payer: Self-pay | Admitting: Family Medicine

## 2015-05-24 ENCOUNTER — Ambulatory Visit (INDEPENDENT_AMBULATORY_CARE_PROVIDER_SITE_OTHER): Payer: 59 | Admitting: Family Medicine

## 2015-05-24 VITALS — BP 112/74 | Temp 98.4°F | Ht 73.0 in | Wt 288.0 lb

## 2015-05-24 DIAGNOSIS — Z800 Family history of malignant neoplasm of digestive organs: Secondary | ICD-10-CM | POA: Insufficient documentation

## 2015-05-24 DIAGNOSIS — K760 Fatty (change of) liver, not elsewhere classified: Secondary | ICD-10-CM | POA: Insufficient documentation

## 2015-05-24 DIAGNOSIS — K219 Gastro-esophageal reflux disease without esophagitis: Secondary | ICD-10-CM

## 2015-05-24 DIAGNOSIS — R739 Hyperglycemia, unspecified: Secondary | ICD-10-CM | POA: Insufficient documentation

## 2015-05-24 DIAGNOSIS — Z0000 Encounter for general adult medical examination without abnormal findings: Secondary | ICD-10-CM

## 2015-05-24 DIAGNOSIS — R110 Nausea: Secondary | ICD-10-CM

## 2015-05-24 DIAGNOSIS — M8540 Solitary bone cyst, unspecified site: Secondary | ICD-10-CM | POA: Insufficient documentation

## 2015-05-24 NOTE — Assessment & Plan Note (Signed)
Right lower hip pain, was later on imaging found to have a cystic/lytic lesion. [facility] started with MRI. CT guided biopsy. Nondiagnostic.  MRI at 3 and 6 months was stable- plan 1 full year follow up from last mri at this point. Continue ortho follow up

## 2015-05-24 NOTE — Assessment & Plan Note (Signed)
S: noted in workup for nausea. LFTs had been elevated. Back to normal after weight loss A/P: continue weight loss efforts. Check LFTs at next year CPE

## 2015-05-24 NOTE — Assessment & Plan Note (Signed)
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 HIDA scan and US and CT abd pelvis. No findings except for fatty liver. Diet a lot of carbs- has improved/resolved with cutting carbs down and losing weight. 307 to 271 now back to 288 on low carb diet. Struggle exercising with 2 kids.  A/P: continue efforts at weight loss, has GI follow up if needed

## 2015-05-24 NOTE — Progress Notes (Signed)
Tana Conch, MD Phone: [phone]  Subjective:  Patient presents today to establish care as new patient. Has followed intermittently with Dr Hyman Hopes.  Chief complaint-noted.   See problem oriented charting  The following were reviewed and entered/updated in epic: Past Medical History  Diagnosis Date  . Fatty liver   . OSA (obstructive sleep apnea)   . Hiatal hernia    Patient Active Problem List   Diagnosis Date Noted  . Bone cyst, solitary 05/24/2015    Priority: Medium  . Hyperglycemia 05/24/2015    Priority: Medium  . Fatty liver     Priority: Medium  . OSA (obstructive sleep apnea) 01/01/2015    Priority: Medium  . Nausea without vomiting 05/24/2015    Priority: Low  . GERD (gastroesophageal reflux disease) 05/24/2015    Priority: Low  . Family history of colon cancer 05/24/2015    Priority: Low   Past Surgical History  Procedure Laterality Date  . Ct guided biopsy      cystic lesion- nondiagnostic but followed radiographically  . Broken nose      Family History  Problem Relation Age of Onset  . Colon cancer Father     mid 102s  . Liver cancer Father     decesed- age 41. NASH etiology  . Diabetes Father     57s  . Hypertension Father   . Diabetes Mother     63s  . Hypertension Mother   . CVA Mother     31    Medications- reviewed and updated Current Outpatient Prescriptions  Medication Sig Dispense Refill  . pantoprazole (PROTONIX) 40 MG tablet Take 1 tablet (40 mg total) by mouth daily. (Patient not taking: Reported on 05/24/2015) 90 tablet 3   No current facility-administered medications for this visit.    Allergies-reviewed and updated No Known Allergies  Social History   Social History  . Marital Status: Married    Spouse Name: N/A  . Number of Children: N/A  . Years of Education: N/A   Occupational History  . physician    Social History Main Topics  . Smoking status: Never Smoker   . Smokeless tobacco: None  . Alcohol Use:  No  . Drug Use: No  . Sexual Activity: Not Asked   Other Topics Concern  . None   Social History Narrative   Married. 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 Oropharynx normal. TM normal. Eyes: sclera and lids normal, PERRLA Neck: no thyromegaly, no cervical lymphadenopathy CV: RRR no murmurs rubs or gallops Lungs: CTAB no crackles, wheeze, rhonchi Abdomen: soft/nontender/nondistended/normal bowel sounds. No rebound or guarding.  Ext: no edema, 2+ PT pulses Skin: warm, dry, no rash Neuro: 5/5 strength in upper and lower extremities, normal gait, normal reflexes   Assessment/Plan:  39 y.o.  male presenting for annual physical.  Health Maintenance counseling: 1. Anticipatory guidance: Patient counseled regarding regular dental exams, wearing seatbelts.  2. Risk factor reduction:  Advised patient of need for regular exercise and diet rich and fruits and vegetables to reduce risk of heart attack and stroke.  3. Immunizations/screenings/ancillary studies Health Maintenance Due  Topic Date Due  . HIV Screening - next labs 05/22/1989  . TETANUS/TDAP  - through work 3 years ago 05/22/1993  4. Prostate cancer screening- start at 50  5. 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 HIDA scan and US and CT abd  pelvis. No findings except for fatty liver. Diet a lot of carbs- has improved/resolved with cutting carbs down and losing weight. 307 to 271 now back to 288 on low carb diet. Struggle exercising with 2 kids.  A/P: continue efforts at weight loss, has GI follow up if needed   Fatty liver S: noted in workup for nausea. LFTs had been elevated. Back to normal after weight loss A/P: continue weight loss efforts. Check LFTs at next year CPE   GERD (gastroesophageal reflux disease) Controlled on protonix. HOpeful with weight loss may be able to come off medication. Known hiatal hernia  Bone cyst, solitary Right lower hip pain, was later on imaging found to have a cystic/lytic lesion. [facility] with [facility]. CT guided biopsy. Nondiagnostic.  MRI at 3 and 6 months was stable- plan 1 full year follow up from last mri at this point. Continue ortho follow up   Hyperglycemia Check a1c next year. Encouraged need for healthy eating, regular exercise, weight loss. Goal 240 in 1 year   1 year CPE

## 2015-05-24 NOTE — Patient Instructions (Addendum)
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 Goal 150 minutes a week exercise at least on the off weeks.   See me back in a year- labs a week before if you dont mind so we can review- ask them to include a1c with normal labs

## 2015-05-24 NOTE — Assessment & Plan Note (Addendum)
Check a1c next year. Encouraged need for healthy eating, regular exercise, weight loss. Goal 240 in 1 year

## 2015-05-24 NOTE — Assessment & Plan Note (Addendum)
Controlled on protonix. HOpeful with weight loss may be able to come off medication. Known hiatal hernia

## 2015-10-12 MED FILL — ONDANSETRON ODT 4 MG TABLET: 4 | 8 days supply | Qty: 30 | Fill #0

## 2015-10-26 MED FILL — OSELTAMIVIR PHOS 75 MG CAP: 75 | 10 days supply | Qty: 10 | Fill #0

## 2015-11-08 MED FILL — PANTOPRAZOLE SOD DR 40 MG T: 40 | 90 days supply | Qty: 180 | Fill #0

## 2015-11-30 ENCOUNTER — Other Ambulatory Visit (HOSPITAL_COMMUNITY): Payer: Self-pay | Admitting: *Deleted

## 2015-11-30 ENCOUNTER — Encounter (HOSPITAL_COMMUNITY): Payer: Self-pay | Admitting: Internal Medicine

## 2015-11-30 ENCOUNTER — Ambulatory Visit (HOSPITAL_COMMUNITY)
Admission: RE | Admit: 2015-11-30 | Discharge: 2015-11-30 | Disposition: A | Discharge status: Home / Self Care | Payer: 59 | Source: Ambulatory Visit | Attending: Family Medicine | Admitting: Family Medicine

## 2015-11-30 ENCOUNTER — Ambulatory Visit (HOSPITAL_BASED_OUTPATIENT_CLINIC_OR_DEPARTMENT_OTHER)
Admission: RE | Admit: 2015-11-30 | Discharge: 2015-11-30 | Disposition: A | Discharge status: Home / Self Care | Payer: 59 | Source: Ambulatory Visit | Attending: Internal Medicine | Admitting: Internal Medicine

## 2015-11-30 VITALS — BP 126/84 | HR 94 | Ht 73.0 in | Wt 312.0 lb

## 2015-11-30 DIAGNOSIS — R0600 Dyspnea, unspecified: Secondary | ICD-10-CM | POA: Insufficient documentation

## 2015-11-30 DIAGNOSIS — R0789 Other chest pain: Secondary | ICD-10-CM | POA: Insufficient documentation

## 2015-11-30 DIAGNOSIS — R0609 Other forms of dyspnea: Secondary | ICD-10-CM

## 2015-11-30 DIAGNOSIS — K760 Fatty (change of) liver, not elsewhere classified: Secondary | ICD-10-CM

## 2015-11-30 DIAGNOSIS — I471 Supraventricular tachycardia: Secondary | ICD-10-CM

## 2015-11-30 NOTE — Progress Notes (Signed)
Echocardiogram 2D Echocardiogram has been performed.  Dorothey BasemanReel, Charlese Gruetzmacher M 11/30/2015, 1:31 PM

## 2015-11-30 NOTE — Progress Notes (Signed)
Patient ID: Charles David, male   DOB: 02/16/1976, [age] y.o.   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   Says he began to get SOB a few months ago. Since that time much more SOB. Now cannot go up one flight of steps without getting SOB. No problems with flat ground. No CP, orthopnea or PND. No recent travel.   Mild cough. Occasional wheezing due to allergies. No tobacco use. Has gained 10 pounds over last year. Wears CPAP regularly   Has been worked up for lytic bone mass in R pelvis. Felt to be benign cyst   Echo today EF 60% no RWMA. RV ok. Normal DD   FHX:  Maternal uncles with CAD in 7350s Mom: ok Father; died from liver CA. No heart disease  Review of Systems: [y] = yes, [ ]  = no   General: Weight gain [place]ienne[y ]; Weight loss [ ] ; Anorexia [ ] ; Fatigue [ ] ; Fever [ ] ; Chills [ ] ; Weakness [ ]   Cardiac: Chest pain/pressure [ ] ; Resting SOB [ ] ; Exertional SOB [ y]; Orthopnea [ ] ; Pedal Edema [ ] ; Palpitations [ ] ; Syncope [ ] ; Presyncope [ ] ; Paroxysmal nocturnal dyspnea[ ]   Pulmonary: Cough [ ] ; Wheezing[ ] ; Hemoptysis[ ] ; Sputum [ ] ; Snoring [ ]   GI: Vomiting[ ] ; Dysphagia[ ] ; Melena[ ] ; Hematochezia [ ] ; Heartburn[ ] ; Abdominal pain [ ] ; Constipation [ ] ; Diarrhea [ ] ; BRBPR [ ]   GU: Hematuria[ ] ; Dysuria [ ] ; Nocturia[ ]   Vascular: Pain in legs with walking [ ] ; Pain in feet with lying flat [ ] ; Non-healing sores [ ] ; Stroke [ ] ; TIA [ ] ; Slurred speech [ ] ;  Neuro: Headaches[ ] ; Vertigo[ ] ; Seizures[ ] ; Paresthesias[ ] ;Blurred vision [ ] ; Diplopia [ ] ; Vision changes [ ]   Ortho/Skin: Arthritis [ ] ; Joint pain [ ] ; Muscle pain [ ] ; Joint swelling [ ] ; Back Pain [ ] ; Rash [ ]   Psych: Depression[ ] ; Anxiety[ ]   Heme: Bleeding problems [ ] ; Clotting disorders  [ ] ; Anemia [ ]   Endocrine: Diabetes [ ] ; Thyroid dysfunction[ ]    Past Medical History  Diagnosis Date  . Fatty liver   . OSA (obstructive sleep apnea)   . Hiatal hernia     Current Outpatient Prescriptions  Medication Sig Dispense Refill  . pantoprazole (PROTONIX) 40 MG tablet Take 1 tablet (40 mg total) by mouth daily. (Patient not taking: Reported on 05/24/2015) 90 tablet 3   No current facility-administered medications for this encounter.    No Known Allergies    Social History   Social History  . Marital Status: Married    Spouse Name: N/A  . Number of Children: N/A  . Years of Education: N/A   Occupational History  . physician    Social History Main Topics  . Smoking status: Never Smoker   . Smokeless tobacco: Not on file  . Alcohol Use: No  . Drug Use: No  . Sexual Activity: Not on file   Other Topics Concern  . Not on file   Social History Narrative   Married. 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 Colon cancer Father     mid 45s  . Liver cancer Father     decesed- age 10. NASH etiology  . Diabetes Father     11s  . Hypertension Father   . Diabetes Mother     68s  . Hypertension Mother   . CVA Mother     27   Filed Vitals:   11/30/15 1415  BP: 126/84  Pulse: 94  Height:  (1.854 m)  Weight: 312 lb (141.522 kg)  SpO2: 95%   Hall walk: Sats 94-96%. Very brisk HR response to exercise with HR near 120 with 1 flight of steps  PHYSICAL EXAM: General:  Well appearing. No respiratory difficulty HEENT: normal Neck: supple. no JVD. Carotids 2+ bilat; no bruits. No lymphadenopathy or thryomegaly appreciated. Cor: PMI nondisplaced. Regular rate & rhythm. No rubs, gallops or  murmurs. Lungs: clear Abdomen:  Obese soft, nontender, nondistended. No hepatosplenomegaly. No bruits or masses. Good bowel sounds. Extremities: no cyanosis, clubbing, rash, edema Neuro: alert & oriented x 3, cranial nerves grossly intact. moves all 4 extremities w/o difficulty. Affect pleasant.  ECG: NSR with IVCD. No ST-T wave abnormalities.    ASSESSMENT & PLAN:  1) Exertional dyspnea, severe - I suspect this is mainly due to deconditioning and his body habitus. However he does have RFs for underlying CAD. Will proceed with CPX testing and coronary CT to further evaluate.   Phillipe Clemon,MD 2:48 PM

## 2015-11-30 NOTE — Patient Instructions (Signed)
Your physician recommends that you schedule a follow-up appointment in: 3 months  Your physician has recommended that you have a cardiopulmonary stress test (CPX). CPX testing is a non-invasive measurement of heart and lung function. It replaces a traditional treadmill stress test. This type of test provides a tremendous amount of information that relates not only to your present condition but also for future outcomes. This test combines measurements of you ventilation, respiratory gas exchange in the lungs, electrocardiogram (EKG), blood pressure and physical response before, during, and following an exercise protocol.  Your physician has requested that you have cardiac CT. Cardiac computed tomography (CT) is a painless test that uses an x-ray machine to take clear, detailed pictures of your heart. For further information please visit [email or web address]. Please follow instruction sheet as given.

## 2015-12-02 DIAGNOSIS — R0609 Other forms of dyspnea: Secondary | ICD-10-CM

## 2015-12-10 ENCOUNTER — Other Ambulatory Visit (HOSPITAL_COMMUNITY): Payer: Self-pay | Admitting: *Deleted

## 2015-12-10 ENCOUNTER — Ambulatory Visit (HOSPITAL_COMMUNITY): Payer: 59

## 2015-12-10 DIAGNOSIS — R0600 Dyspnea, unspecified: Secondary | ICD-10-CM | POA: Diagnosis not present

## 2015-12-10 DIAGNOSIS — M47894 Other spondylosis, thoracic region: Secondary | ICD-10-CM | POA: Diagnosis not present

## 2015-12-10 DIAGNOSIS — K760 Fatty (change of) liver, not elsewhere classified: Secondary | ICD-10-CM

## 2015-12-11 ENCOUNTER — Ambulatory Visit (HOSPITAL_COMMUNITY)
Admission: RE | Admit: 2015-12-11 | Discharge: 2015-12-11 | Disposition: A | Discharge status: Home / Self Care | Payer: 59 | Source: Ambulatory Visit | Attending: Internal Medicine | Admitting: Internal Medicine

## 2015-12-11 ENCOUNTER — Encounter (HOSPITAL_COMMUNITY): Payer: Self-pay

## 2015-12-11 DIAGNOSIS — K760 Fatty (change of) liver, not elsewhere classified: Secondary | ICD-10-CM | POA: Diagnosis not present

## 2015-12-11 DIAGNOSIS — M47894 Other spondylosis, thoracic region: Secondary | ICD-10-CM | POA: Insufficient documentation

## 2015-12-11 DIAGNOSIS — R079 Chest pain, unspecified: Secondary | ICD-10-CM

## 2015-12-11 MED ORDER — METOPROLOL TARTRATE 5 MG/5ML IV SOLN
INTRAVENOUS | Status: AC
  Filled 2015-12-11: qty 15

## 2015-12-11 MED ORDER — NITROGLYCERIN 0.4 MG SL SUBL
0.8000 mg | SUBLINGUAL_TABLET | Freq: Once | SUBLINGUAL | Status: AC
  Administered 2015-12-11: 0.8 mg via SUBLINGUAL
  Filled 2015-12-11: qty 25

## 2015-12-11 MED ORDER — METOPROLOL TARTRATE 5 MG/5ML IV SOLN
5.0000 mg | INTRAVENOUS | Status: DC | PRN
Start: 2015-12-11 — End: 2015-12-12
  Administered 2015-12-11 (×3): 5 mg via INTRAVENOUS
  Filled 2015-12-11 (×4): qty 5

## 2015-12-11 MED ORDER — NITROGLYCERIN 0.4 MG SL SUBL
SUBLINGUAL_TABLET | SUBLINGUAL | Status: AC
  Filled 2015-12-11: qty 2

## 2015-12-11 MED ORDER — IOPAMIDOL (ISOVUE-370) INJECTION 76%
INTRAVENOUS | Status: AC
Start: 2015-12-11 — End: 2015-12-11
  Administered 2015-12-11: 80 mL
  Filled 2015-12-11: qty 100

## 2015-12-18 MED FILL — AMOX TR-K CLV 875-125 MG TA: 875-125 | 10 days supply | Qty: 20 | Fill #0

## 2016-01-16 ENCOUNTER — Ambulatory Visit (INDEPENDENT_AMBULATORY_CARE_PROVIDER_SITE_OTHER): Payer: 59 | Admitting: Podiatry

## 2016-01-16 ENCOUNTER — Encounter: Payer: Self-pay | Admitting: Podiatry

## 2016-01-16 VITALS — BP 133/83 | HR 79 | Resp 14

## 2016-01-16 DIAGNOSIS — B070 Plantar wart: Secondary | ICD-10-CM | POA: Diagnosis not present

## 2016-01-16 DIAGNOSIS — B079 Viral wart, unspecified: Secondary | ICD-10-CM

## 2016-01-16 DIAGNOSIS — L989 Disorder of the skin and subcutaneous tissue, unspecified: Secondary | ICD-10-CM

## 2016-01-16 NOTE — Progress Notes (Addendum)
   Subjective:    Patient ID: Charles David, male    DOB: 06/22/1976, 40 y.o.   MRN: [id number]  HPI this patient presents to the office with chief complaint of a wart on the inside of his right foot says it started out as a single wart. 2 years ago and was never painful. He says the wart has started to grow and multiply and he used wart treatment and the wart persists.  He presents the office today desiring an evaluation and treatment of this wart on his right foot    Review of Systems  All other systems reviewed and are negative.      Objective:   Physical Exam GENERAL APPEARANCE: Alert, conversant. Appropriately groomed. No acute distress.  VASCULAR: Pedal pulses are  palpable at  [facility] and PT bilateral.  Capillary refill time is immediate to all digits,  Normal temperature gradient.  Digital hair growth is present bilateral  NEUROLOGIC: sensation is normal to 5.07 monofilament at 5/5 sites bilateral.  Light touch is intact bilateral, Muscle strength normal.  MUSCULOSKELETAL: acceptable muscle strength, tone and stability bilateral.  Intrinsic muscluature intact bilateral.  Rectus appearance of foot and digits noted bilateral.   DERMATOLOGIC: skin color, texture, and turgor are within normal limits.  No preulcerative lesions or ulcers  are seen, no interdigital maceration noted.  No open lesions present.  Digital nails are asymptomatic. No drainage noted. 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  Cauliflower appearing tissue with multiple warts .         Assessment & Plan:  Benign skin lesion.  Verrucae  IE  Application canthacur to mosaic warts.  To be seen in 1 week for follow up.   Helane GuntherGregory Mayer DPM

## 2016-01-18 ENCOUNTER — Ambulatory Visit: Payer: 59 | Admitting: Podiatry

## 2016-01-21 ENCOUNTER — Encounter: Payer: Self-pay | Admitting: Podiatry

## 2016-01-21 ENCOUNTER — Ambulatory Visit (INDEPENDENT_AMBULATORY_CARE_PROVIDER_SITE_OTHER): Payer: 59 | Admitting: Podiatry

## 2016-01-21 DIAGNOSIS — L989 Disorder of the skin and subcutaneous tissue, unspecified: Secondary | ICD-10-CM

## 2016-01-21 DIAGNOSIS — B079 Viral wart, unspecified: Secondary | ICD-10-CM

## 2016-01-21 NOTE — Progress Notes (Signed)
Subjective:     Patient ID: Charles David, male   DOB: 03/06/1976, 40 y.o.   MRN: [id number]  HPI this patient presents the office follow-up for treatment of a wart on his right midfoot. He had canthacur  applied at the [facility] 1 week ago. He presents the office today for follow-up of the application and the debridement of the necrotic tissue that has developed on his right foot. He does admit pain was present for 2 days after the application of the acid Review of Systems     Objective:   Physical Exam GENERAL APPEARANCE: Alert, conversant. Appropriately groomed. No acute distress.  VASCULAR: Pedal pulses are  palpable at  [facility] bilateral.  Capillary refill time is immediate to all digits,  Normal temperature gradient.  Digital hair growth is present bilateral  NEUROLOGIC: sensation is normal to 5.07 monofilament at 5/5 sites bilateral.  Light touch is intact bilateral, Muscle strength normal.  MUSCULOSKELETAL: acceptable muscle strength, tone and stability bilateral.  Intrinsic muscluature intact bilateral.  Rectus appearance of foot and digits noted bilateral.   DERMATOLOGIC: skin color, texture, and turgor are within normal limits.  No preulcerative lesions or ulcers  are seen, no interdigital maceration noted.  No open lesions present.  Digital nails are asymptomatic. No drainage noted. Red necrotic tissue noted at the medial aspect of the midfoot, right foot. No evidence of any redness, swelling or infection     Assessment:  Verrucae     Plan:     Debridement of necrotic tissue.  Neosporin/DSD applied.  Patient told to allow this to heal and if the wart returns he should make a follow up appointment.   Helane GuntherGregory Trevious Rampey DPM

## 2016-05-13 MED FILL — PANTOPRAZOLE SOD DR 40 MG T: 40 | 90 days supply | Qty: 180 | Fill #1

## 2016-07-02 ENCOUNTER — Ambulatory Visit (INDEPENDENT_AMBULATORY_CARE_PROVIDER_SITE_OTHER): Payer: 59 | Admitting: Podiatry

## 2016-07-02 ENCOUNTER — Encounter: Payer: Self-pay | Admitting: Podiatry

## 2016-07-02 VITALS — BP 126/84 | HR 67 | Resp 16

## 2016-07-02 DIAGNOSIS — B070 Plantar wart: Secondary | ICD-10-CM | POA: Diagnosis not present

## 2016-07-02 DIAGNOSIS — L989 Disorder of the skin and subcutaneous tissue, unspecified: Secondary | ICD-10-CM | POA: Diagnosis not present

## 2016-07-02 NOTE — Progress Notes (Signed)
   Subjective:    Patient ID: Charles David, male    DOB: 02/28/1976, 40 y.o.   MRN: [id number]  HPI this patient presents to the office with chief complaint of a wart on the inside of his right foot .  His wart was treated by myself approximately 5 months ago with acid. He says that the wart returned approximately 2 months ago and has been growing. He says he has not had much pain or discomfort except for an occasional twinge. He states that he desires permanent excision of the wart on the bottom of his right foot.    Review of Systems  All other systems reviewed and are negative.      Objective:   Physical Exam GENERAL APPEARANCE: Alert, conversant. Appropriately groomed. No acute distress.  VASCULAR: Pedal pulses are  palpable at  [facility] bilateral.  Capillary refill time is immediate to all digits,  Normal temperature gradient.  Digital hair growth is present bilateral  NEUROLOGIC: sensation is normal to 5.07 monofilament at 5/5 sites bilateral.  Light touch is intact bilateral, Muscle strength normal.  MUSCULOSKELETAL: acceptable muscle strength, tone and stability bilateral.  Intrinsic muscluature intact bilateral.  Rectus appearance of foot and digits noted bilateral.   DERMATOLOGIC: skin color, texture, and turgor are within normal limits.  No preulcerative lesions or ulcers  are seen, no interdigital maceration noted.  No open lesions present.  Digital nails are asymptomatic. No drainage noted. 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  Cauliflower appearing tissue with multiple warts .         Assessment & Plan:  Benign skin lesion.  Verrucae ROV  Wart excision.  This patient was anesthetized with mixture of 2% lidocaine plain and 2 % lidocaine with epi. at the site of the skin lesion.  The surgical site was then washed with betadine and alcohol.  Using a punch the lesion was excised and passed off as specimen.  The surgical site was cauterized with phenol and  washed with alcohol.  The site was bandaged with neosporin, sterile 2x2 and kling.  Home instructions given. RTC 1 week   Charles David DPM   Charles David DPM

## 2016-07-04 ENCOUNTER — Telehealth: Payer: Self-pay | Admitting: *Deleted

## 2016-07-04 DIAGNOSIS — B070 Plantar wart: Secondary | ICD-10-CM

## 2016-07-04 NOTE — Addendum Note (Signed)
Addended by: Alphia Kava'CONNELL, VALERY D on: 07/04/2016 09:08 AM   Modules accepted: Orders

## 2016-07-04 NOTE — Telephone Encounter (Signed)
[facility] verruca.

## 2016-07-08 ENCOUNTER — Encounter: Payer: Self-pay | Admitting: Podiatry

## 2016-07-08 ENCOUNTER — Ambulatory Visit (INDEPENDENT_AMBULATORY_CARE_PROVIDER_SITE_OTHER): Payer: Self-pay | Admitting: Podiatry

## 2016-07-08 VITALS — BP 148/107 | HR 101 | Resp 16

## 2016-07-08 DIAGNOSIS — Z09 Encounter for follow-up examination after completed treatment for conditions other than malignant neoplasm: Secondary | ICD-10-CM

## 2016-07-08 NOTE — Progress Notes (Signed)
This patient returns 1 week following excision of wart.  He has been soaking his foot and bandaging his foot.  He has no pain or discomfort.  He presents for his first post op visit.    Objective  Neurovascular status same as previous visit.  There is healthy granulation tissue at the surgical site.  No redness or swelling pus or pain noted.   A  Post op visit.  P.  ROV.  Neosporin/DSD.   Continue soaks and bandaging to surgical site.  RTC prn.   Helane GuntherGregory Briea Mcenery DPM

## 2016-09-24 ENCOUNTER — Ambulatory Visit (INDEPENDENT_AMBULATORY_CARE_PROVIDER_SITE_OTHER): Payer: 59 | Admitting: Podiatry

## 2016-09-24 DIAGNOSIS — B070 Plantar wart: Secondary | ICD-10-CM | POA: Diagnosis not present

## 2016-09-24 DIAGNOSIS — L989 Disorder of the skin and subcutaneous tissue, unspecified: Secondary | ICD-10-CM | POA: Diagnosis not present

## 2016-09-24 NOTE — Progress Notes (Signed)
This patient presents the office with chief complaint of possible recurrence of a wart on his right inside foot. He says that he was swimming and he believes she saw the return of the wart that was previously removed by myself in December 2017. He says he is not having pain and discomfort, but is concerned that the wart can regrow and then become dense the office today for continued evaluation and treatment of his right foot.  GENERAL APPEARANCE: Alert, conversant. Appropriately groomed. No acute distress.  VASCULAR: Pedal pulses are  palpable at  [facility] bilateral.  Capillary refill time is immediate to all digits,  Normal temperature gradient.  Digital hair growth is present bilateral  NEUROLOGIC: sensation is normal to 5.07 monofilament at 5/5 sites bilateral.  Light touch is intact bilateral, Muscle strength normal.  MUSCULOSKELETAL: acceptable muscle strength, tone and stability bilateral.  Intrinsic muscluature intact bilateral.  Rectus appearance of foot and digits noted bilateral.   DERMATOLOGIC: skin color, texture, and turgor are within normal limits.  No preulcerative lesions or ulcers  are seen, no interdigital maceration noted.  No open lesions present.  Digital nails are asymptomatic. No drainage noted. No cauliflower tissue noted planar right foot but bleeding noted upon debridement of skin over area where wart was previously removed.  Benign skin lesion.  ROV  Debridement skin lesion does reveal bleeding noted from multiple sites similar to what would be seen with a verrucous lesion. No pain is noted upon palpation. There does appear white scar tissue noted at the site as opposed to cauliflower appearing tissue. I therefore recommended that the area the bandage with salicylic acid when it becomes symptomatic discussed with the patient how to apply the acid. Return to the clinic when necessary   Helane GuntherGregory Omid Deardorff DPM

## 2016-10-08 MED FILL — ONDANSETRON HCL 4 MG TABLET: 4 | 8 days supply | Qty: 30 | Fill #0 | Status: TO

## 2016-10-31 MED FILL — ONDANSETRON ODT 4 MG TABLET: 4 | 8 days supply | Qty: 30 | Fill #0

## 2016-11-19 ENCOUNTER — Other Ambulatory Visit (HOSPITAL_COMMUNITY): Payer: Self-pay | Admitting: Orthopedic Surgery

## 2016-11-19 DIAGNOSIS — Z09 Encounter for follow-up examination after completed treatment for conditions other than malignant neoplasm: Secondary | ICD-10-CM | POA: Diagnosis not present

## 2016-11-19 DIAGNOSIS — M899 Disorder of bone, unspecified: Secondary | ICD-10-CM

## 2016-11-27 ENCOUNTER — Other Ambulatory Visit (HOSPITAL_COMMUNITY)
Admission: RE | Admit: 2016-11-27 | Discharge: 2016-11-27 | Disposition: A | Discharge status: Home / Self Care | Payer: 59 | Source: Ambulatory Visit | Attending: Internal Medicine | Admitting: Internal Medicine

## 2016-11-27 DIAGNOSIS — R5383 Other fatigue: Secondary | ICD-10-CM | POA: Insufficient documentation

## 2016-11-27 DIAGNOSIS — E669 Obesity, unspecified: Secondary | ICD-10-CM | POA: Insufficient documentation

## 2016-11-27 LAB — COMPREHENSIVE METABOLIC PANEL
ALT: 32 U/L (ref 17–63)
AST: 23 U/L (ref 15–41)
Albumin: 4.6 g/dL (ref 3.5–5.0)
Alkaline Phosphatase: 62 U/L (ref 38–126)
Anion gap: 8 (ref 5–15)
BUN: 16 mg/dL (ref 6–20)
CHLORIDE: 102 mmol/L (ref 101–111)
CO2: 29 mmol/L (ref 22–32)
CREATININE: 0.86 mg/dL (ref 0.61–1.24)
Calcium: 9.5 mg/dL (ref 8.9–10.3)
GFR calc non Af Amer: >60 mL/min (ref >60)
Glucose, Bld: 101 mg/dL — ABNORMAL HIGH (ref 65–99)
POTASSIUM: 3.8 mmol/L (ref 3.5–5.1)
SODIUM: 139 mmol/L (ref 135–145)
Total Bilirubin: 0.8 mg/dL (ref 0.3–1.2)
Total Protein: 7.5 g/dL (ref 6.5–8.1)

## 2016-11-27 LAB — CBC
HEMATOCRIT: 42.1 % (ref 39.0–52.0)
Hemoglobin: 14.8 g/dL (ref 13.0–17.0)
MCH: 30.1 pg (ref 26.0–34.0)
MCHC: 35.2 g/dL (ref 30.0–36.0)
MCV: 85.6 fL (ref 78.0–100.0)
PLATELETS: 189 10*3/uL (ref 150–400)
RBC: 4.92 MIL/uL (ref 4.22–5.81)
RDW: 12.8 % (ref 11.5–15.5)
WBC: 9.2 10*3/uL (ref 4.0–10.5)

## 2016-11-28 LAB — HEMOGLOBIN A1C
HEMOGLOBIN A1C: 5.5 % (ref 4.8–5.6)
MEAN PLASMA GLUCOSE: 111 mg/dL

## 2016-12-03 ENCOUNTER — Ambulatory Visit (HOSPITAL_COMMUNITY)
Admission: RE | Admit: 2016-12-03 | Discharge: 2016-12-03 | Disposition: A | Discharge status: Home / Self Care | Payer: 59 | Source: Ambulatory Visit | Attending: Orthopedic Surgery | Admitting: Orthopedic Surgery

## 2016-12-03 DIAGNOSIS — M899 Disorder of bone, unspecified: Secondary | ICD-10-CM | POA: Insufficient documentation

## 2016-12-03 DIAGNOSIS — M898X8 Other specified disorders of bone, other site: Secondary | ICD-10-CM | POA: Diagnosis not present

## 2016-12-03 DIAGNOSIS — Z09 Encounter for follow-up examination after completed treatment for conditions other than malignant neoplasm: Secondary | ICD-10-CM | POA: Diagnosis not present

## 2016-12-03 MED ORDER — GADOBENATE DIMEGLUMINE 529 MG/ML IV SOLN
20.0000 mL | Freq: Once | INTRAVENOUS | Status: AC | PRN
  Administered 2016-12-03: 20 mL via INTRAVENOUS

## 2016-12-08 DIAGNOSIS — M899 Disorder of bone, unspecified: Secondary | ICD-10-CM | POA: Diagnosis not present

## 2016-12-08 DIAGNOSIS — Z09 Encounter for follow-up examination after completed treatment for conditions other than malignant neoplasm: Secondary | ICD-10-CM | POA: Diagnosis not present

## 2017-01-05 MED FILL — PANTOPRAZOLE SOD DR 40 MG T: 40 | 90 days supply | Qty: 180 | Fill #0

## 2017-01-31 IMAGING — CT CT HEART MORP W/ CTA COR W/ SCORE W/ CA W/CM &/OR W/O CM
1 of 10 series · 1 of 20 positions shown, 2 images · IV contrast (Iodine)
Comparison: None.

CLINICAL DATA: Chest pain and Dyspnea

EXAM:
Cardiac CTA
MEDICATIONS:
Sub lingual nitro. 4mg and lopressor 15mg
TECHNIQUE: The patient was scanned on a [employer] [REDACTED]ice scanner. Gantry
rotation speed was 270 msecs. Collimation was .9mm. A 100 kV
prospective scan was triggered in the descending thoracic aorta at
111 HU's with 5% padding centered around 78% of the R-R interval.
Average HR during the scan was 68 bpm. The 3D data set was
interpreted on a dedicated work station using MPR, MIP and VRT
modes. A total of 80cc of contrast was used.

[Series 300: locator · axial · 0.35mm/px · z∈[-136,-136]mm · 1 of 1 slices shown, 2 images]
[im 1/1  vessel]
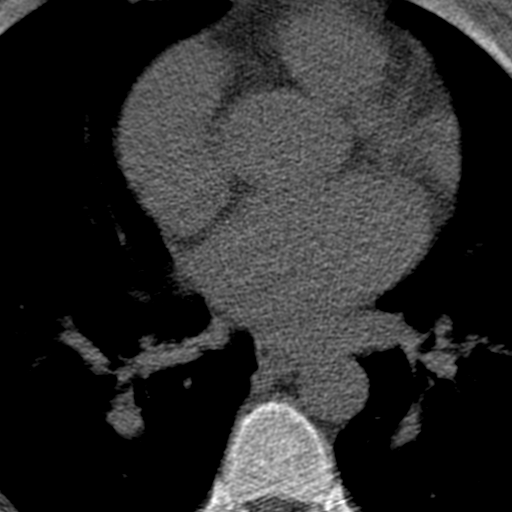
[im 1/1  lung]
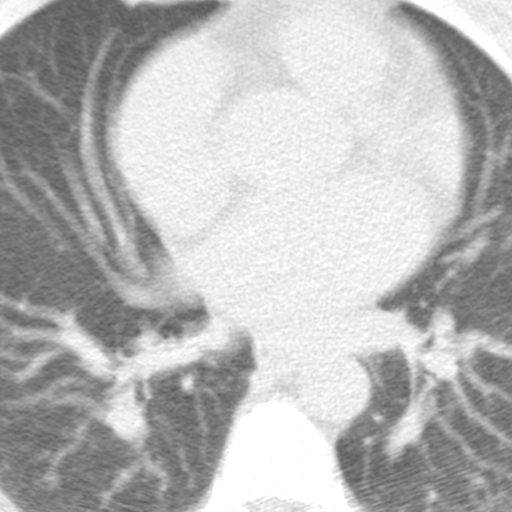

[1 of 20 positions shown; findings below may reference images not displayed]

FINDINGS: Non-cardiac: See separate report from [REDACTED]. No
significant findings on limited lung and soft tissue windows.

Calcium Score:0

Coronary Arteries: Right dominant with no anomalies

LM: Normal

LAD:  Normal

D1: Normal

D2: Normal

Circumflex:  Left dominant circulation normal

OM1: Normal

OM2: Normal

RCA:  Small non dominant and normal
IMPRESSION: 1) Calcium score 0

2) Normal left dominant coronary arteries distal circumflex not well
seen due to body habitus

3) No significant non cardiac findings

Jantosik Chmelik

EXAM:
OVER-READ INTERPRETATION  CT CHEST

The following report is an over-read performed by radiologist Dr.
over-read does not include interpretation of cardiac or coronary
anatomy or pathology. The coronary calcium score/coronary CTA
interpretation by the cardiologist is attached.
FINDINGS: Mediastinum: The visualized portions of the trachea and lower airway
appear patent. No adenopathy identified within the mediastinum or
hilar region. Visualized portions of the esophagus are unremarkable.

Lungs/pleura: No pleural fluid identified. No suspicious pulmonary
nodule or mass identified. No airspace consolidation.

Upper abdomen: Diffuse hepatic steatosis is identified. No acute
findings noted.

Musculoskeletal: No chest wall mass identified. Degenerative disc
disease is identified within the thoracic spine. No aggressive lytic
or sclerotic bone lesions.
IMPRESSION: 1. No acute pulmonary abnormalities noted.
2. Mild thoracic spondylosis
3. Hepatic steatosis.

## 2017-04-14 MED FILL — hydrALAZINE HCL 50 MG TABS: 50 | 30 days supply | Qty: 90 | Fill #0

## 2017-05-20 MED FILL — PANTOPRAZOLE SOD DR 40 MG T: 40 | 90 days supply | Qty: 180 | Fill #1

## 2017-05-20 MED FILL — hydrALAZINE HCL 50 MG TABS: 50 | 30 days supply | Qty: 90 | Fill #1

## 2017-05-27 DIAGNOSIS — R5383 Other fatigue: Secondary | ICD-10-CM | POA: Diagnosis not present

## 2017-07-01 MED FILL — hydrALAZINE HCL 50 MG TABS: 50 | 30 days supply | Qty: 90 | Fill #2

## 2017-10-29 DIAGNOSIS — R233 Spontaneous ecchymoses: Secondary | ICD-10-CM | POA: Diagnosis not present

## 2018-01-07 MED FILL — PANTOPRAZOLE SOD DR 40 MG T: 40 | 90 days supply | Qty: 90 | Fill #0

## 2018-02-08 MED FILL — AMOX-CLAV 875-125 MG TABLET: 875-125 | 10 days supply | Qty: 20 | Fill #0

## 2018-03-05 ENCOUNTER — Emergency Department (HOSPITAL_COMMUNITY): Payer: 59

## 2018-03-05 ENCOUNTER — Encounter (HOSPITAL_COMMUNITY): Payer: Self-pay | Admitting: Emergency Medicine

## 2018-03-05 ENCOUNTER — Emergency Department (HOSPITAL_COMMUNITY)
Admission: EM | Admit: 2018-03-05 | Discharge: 2018-03-05 | Disposition: A | Discharge status: Home / Self Care | Payer: 59 | Attending: Emergency Medicine | Admitting: Emergency Medicine

## 2018-03-05 DIAGNOSIS — N202 Calculus of kidney with calculus of ureter: Secondary | ICD-10-CM | POA: Insufficient documentation

## 2018-03-05 DIAGNOSIS — N200 Calculus of kidney: Secondary | ICD-10-CM | POA: Diagnosis not present

## 2018-03-05 DIAGNOSIS — N201 Calculus of ureter: Secondary | ICD-10-CM | POA: Diagnosis not present

## 2018-03-05 DIAGNOSIS — R39198 Other difficulties with micturition: Secondary | ICD-10-CM | POA: Diagnosis not present

## 2018-03-05 DIAGNOSIS — R109 Unspecified abdominal pain: Secondary | ICD-10-CM | POA: Diagnosis present

## 2018-03-05 LAB — URINALYSIS, ROUTINE W REFLEX MICROSCOPIC
Bacteria, UA: NONE SEEN
Bilirubin Urine: NEGATIVE
GLUCOSE, UA: NEGATIVE mg/dL
KETONES UR: NEGATIVE mg/dL
Leukocytes, UA: NEGATIVE
NITRITE: NEGATIVE
PH: 5 (ref 5.0–8.0)
Protein, ur: NEGATIVE mg/dL
Specific Gravity, Urine: 1.017 (ref 1.005–1.030)

## 2018-03-05 LAB — CBC WITH DIFFERENTIAL/PLATELET
Abs Immature Granulocytes: 0 10*3/uL (ref 0.0–0.1)
BASOS PCT: 1 %
Basophils Absolute: 0.1 10*3/uL (ref 0.0–0.1)
Eosinophils Absolute: 0.2 10*3/uL (ref 0.0–0.7)
Eosinophils Relative: 2 %
HCT: 43.8 % (ref 39.0–52.0)
Hemoglobin: 14.6 g/dL (ref 13.0–17.0)
Immature Granulocytes: 0 %
Lymphocytes Relative: 47 %
Lymphs Abs: 4 10*3/uL (ref 0.7–4.0)
MCH: 28.7 pg (ref 26.0–34.0)
MCHC: 33.3 g/dL (ref 30.0–36.0)
MCV: 86.1 fL (ref 78.0–100.0)
MONO ABS: 0.7 10*3/uL (ref 0.1–1.0)
MONOS PCT: 8 %
Neutro Abs: 3.6 10*3/uL (ref 1.7–7.7)
Neutrophils Relative %: 42 %
PLATELETS: 199 10*3/uL (ref 150–400)
RBC: 5.09 MIL/uL (ref 4.22–5.81)
RDW: 12.3 % (ref 11.5–15.5)
WBC: 8.6 10*3/uL (ref 4.0–10.5)

## 2018-03-05 LAB — COMPREHENSIVE METABOLIC PANEL
ALT: 45 U/L — ABNORMAL HIGH (ref 0–44)
ANION GAP: 10 (ref 5–15)
AST: 26 U/L (ref 15–41)
Albumin: 4 g/dL (ref 3.5–5.0)
Alkaline Phosphatase: 63 U/L (ref 38–126)
BUN: 17 mg/dL (ref 6–20)
CHLORIDE: 108 mmol/L (ref 98–111)
CO2: 23 mmol/L (ref 22–32)
CREATININE: 0.98 mg/dL (ref 0.61–1.24)
Calcium: 9.1 mg/dL (ref 8.9–10.3)
Glucose, Bld: 110 mg/dL — ABNORMAL HIGH (ref 70–99)
POTASSIUM: 3.6 mmol/L (ref 3.5–5.1)
Sodium: 141 mmol/L (ref 135–145)
Total Bilirubin: 0.7 mg/dL (ref 0.3–1.2)
Total Protein: 6.9 g/dL (ref 6.5–8.1)

## 2018-03-05 LAB — LIPASE, BLOOD: LIPASE: 35 U/L (ref 11–51)

## 2018-03-05 MED ORDER — MORPHINE SULFATE 15 MG PO TABS: 15.0000 mg | ORAL_TABLET | ORAL | 0 refills | Status: DC | PRN

## 2018-03-05 MED ORDER — SODIUM CHLORIDE 0.9 % IV BOLUS
1000.0000 mL | Freq: Once | INTRAVENOUS | Status: AC
  Administered 2018-03-05: 1000 mL via INTRAVENOUS

## 2018-03-05 MED ORDER — TAMSULOSIN HCL 0.4 MG PO CAPS: 0.4000 mg | ORAL_CAPSULE | Freq: Every day | ORAL | 0 refills | Status: DC

## 2018-03-05 MED ORDER — ONDANSETRON 4 MG PO TBDP: 4.0000 mg | ORAL_TABLET | Freq: Three times a day (TID) | ORAL | 0 refills | Status: DC | PRN

## 2018-03-05 MED FILL — ONDANSETRON ODT 4 MG TABLET: 4 | 7 days supply | Qty: 20 | Fill #0

## 2018-03-05 MED FILL — TAMSULOSIN HCL 0.4 MG CAP: 0.4 | 30 days supply | Qty: 30 | Fill #0

## 2018-03-05 MED FILL — MORPHINE SULFATE IR 15 MG T: 15 | 1 days supply | Qty: 5 | Fill #0

## 2018-03-05 NOTE — ED Provider Notes (Signed)
[facility] Provider Note   CSN: [id number] Arrival date & time: 03/05/18  [id number]     History   Chief Complaint Chief Complaint  Patient presents with  . Flank Pain    HPI Charles David is a 42 y.o. male.  42 yo M with a chief complaint of right flank pain.  This is been off and on for the past couple days but worsening about 2 hours ago.  Was sharp and severe.  Felt like he was having trouble urinating.  Denied radiation of the pain.  Denies fevers or chills denies nausea or vomiting.  There is no position that made him comfortable.  It then spontaneously resolved when he arrived to the emergency department.  He is feeling much better.  Denies worsening with movement or palpation or twisting.  Denies trauma to the back.  Denies abdominal pain.  Denies pain in the penis or testicles.  The history is provided by the patient.  Abdominal Pain   This is a new problem. The current episode started 2 days ago. The problem occurs constantly. The problem has been resolved. The pain is associated with an unknown factor. Pain location: R flank. The quality of the pain is sharp and shooting. The pain is at a severity of 10/10. The pain is severe. Pertinent negatives include fever, diarrhea, vomiting, headaches, arthralgias and myalgias. Nothing aggravates the symptoms. The symptoms are relieved by NSAIDs.    Past Medical History:  Diagnosis Date  . Fatty liver   . Hiatal hernia   . OSA (obstructive sleep apnea)     Patient Active Problem List   Diagnosis Date Noted  . DOE (dyspnea on exertion) 12/02/2015  . Nausea without vomiting 05/24/2015  . GERD (gastroesophageal reflux disease) 05/24/2015  . Bone cyst, solitary 05/24/2015  . Hyperglycemia 05/24/2015  . Family history of colon cancer 05/24/2015  . Fatty liver   . OSA (obstructive sleep apnea) 01/01/2015    Past Surgical History:  Procedure Laterality Date  . broken nose    . ct  guided biopsy     cystic lesion- nondiagnostic but followed radiographically        Home Medications    Prior to Admission medications   Medication Sig Start Date End Date Taking? Authorizing Provider  morphine (MSIR) 15 MG tablet Take 1 tablet (15 mg total) by mouth every 4 (four) hours as needed for severe pain. 03/05/18   Melene PlanFloyd, Santonio Speakman, DO  ondansetron (ZOFRAN ODT) 4 MG disintegrating tablet Take 1 tablet (4 mg total) by mouth every 8 (eight) hours as needed for nausea or vomiting. 03/05/18   Melene PlanFloyd, Ellaina Schuler, DO  pantoprazole (PROTONIX) 40 MG tablet Take 1 tablet (40 mg total) by mouth daily. Patient not taking: Reported on 05/24/2015 07/27/14   Quentin AngstJegede, Olugbemiga E, MD  tamsulosin (FLOMAX) 0.4 MG CAPS capsule Take 1 capsule (0.4 mg total) by mouth daily after supper. 03/05/18   Melene PlanFloyd, Delrico Minehart, DO    Family History Family History  Problem Relation Age of Onset  . Diabetes Mother        6050s  . Hypertension Mother   . CVA Mother        673  . Colon cancer Father        mid 7550s  . Liver cancer Father        decesed- age 44. NASH etiology  . Diabetes Father        1550s  . Hypertension Father  Social History Social History   Tobacco Use  . Smoking status: Never Smoker  . Smokeless tobacco: Never Used  Substance Use Topics  . Alcohol use: No    Alcohol/week: 0.0 standard drinks  . Drug use: No     Allergies   Patient has no known allergies.   Review of Systems Review of Systems  Constitutional: Negative for chills and fever.  HENT: Negative for congestion and facial swelling.   Eyes: Negative for discharge and visual disturbance.  Respiratory: Negative for shortness of breath.   Cardiovascular: Negative for chest pain and palpitations.  Gastrointestinal: Negative for abdominal pain, diarrhea and vomiting.  Genitourinary: Positive for difficulty urinating and flank pain.  Musculoskeletal: Negative for arthralgias and myalgias.  Skin: Negative for color change and rash.    Neurological: Negative for tremors, syncope and headaches.  Psychiatric/Behavioral: Negative for confusion and dysphoric mood.     Physical Exam Updated Vital Signs BP 131/88 (BP Location: Left Arm)   Pulse 79   Temp 98.6 F (37 C) (Oral)   Resp 18   Ht 6\' 1"  (1.854 m)   Wt (!) 140.6 kg   SpO2 98%   BMI 40.90 kg/m   Physical Exam  Constitutional: He is oriented to person, place, and time. He appears well-developed and well-nourished.  HENT:  Head: Normocephalic and atraumatic.  Eyes: Pupils are equal, round, and reactive to light. EOM are normal.  Neck: Normal range of motion. Neck supple. No JVD present.  Cardiovascular: Normal rate and regular rhythm. Exam reveals no gallop and no friction rub.  No murmur heard. Pulmonary/Chest: No respiratory distress. He has no wheezes.  Abdominal: He exhibits no distension and no mass. There is no tenderness. There is no rebound and no guarding.  Musculoskeletal: Normal range of motion.  Points to the right cva as area of pain, no rash, no palpable tenderness  Neurological: He is alert and oriented to person, place, and time.  Skin: No rash noted. No pallor.  Psychiatric: He has a normal mood and affect. His behavior is normal.  Nursing note and vitals reviewed.    ED Treatments / Results  Labs (all labs ordered are listed, but only abnormal results are displayed) Labs Reviewed  COMPREHENSIVE METABOLIC PANEL - Abnormal; Notable for the following components:      Result Value   Glucose, Bld 110 (*)    ALT 45 (*)    All other components within normal limits  URINALYSIS, ROUTINE W REFLEX MICROSCOPIC - Abnormal; Notable for the following components:   Hgb urine dipstick SMALL (*)    All other components within normal limits  CBC WITH DIFFERENTIAL/PLATELET  LIPASE, BLOOD    EKG None  Radiology Ct Renal Stone Study  Result Date: 03/05/2018 CLINICAL DATA:  Bilateral flank pain, more right than left, for couple of hours.  EXAM: CT ABDOMEN AND PELVIS WITHOUT CONTRAST TECHNIQUE: Multidetector CT imaging of the abdomen and pelvis was performed following the standard protocol without IV contrast. COMPARISON:  10/31/2014 FINDINGS: Lower chest: Lung bases are clear. Hepatobiliary: Diffuse fatty infiltration of the liver. No focal liver abnormality is seen. No gallstones, gallbladder wall thickening, or biliary dilatation. Pancreas: Unremarkable. No pancreatic ductal dilatation or surrounding inflammatory changes. Spleen: Normal in size without focal abnormality. Adrenals/Urinary Tract: No adrenal gland nodules. Kidneys are symmetrical in size. 10 mm stone in the distal right ureter at the ureterovesical junction with mild right hydronephrosis and hydroureter. Minimal stranding around the ureter. Left kidney and ureter are  decompressed. Bladder wall is not thickened. Stomach/Bowel: Stomach, small bowel, and colon are mostly decompressed. Scattered stool in the colon. No wall thickening although under distention limits evaluation of the wall. No inflammatory stranding. Appendix is normal. Vascular/Lymphatic: No significant vascular findings are present. No enlarged abdominal or pelvic lymph nodes. Reproductive: Prostate is unremarkable. Other: No abdominal wall hernia or abnormality. No abdominopelvic ascites. Musculoskeletal: Multi-cystic lucent lesion in the right iliac bone adjacent to the sacroiliac joint. No periosteal reaction or cortical break. This has been present without change on prior MRI and CT studies dating back to 2015, most likely representing a benign lesion. IMPRESSION: 1. 2 mm stone in the distal right ureter with mild proximal obstruction. 2. Diffuse fatty infiltration of the liver. 3. Unchanged appearance of multi-cystic lucent lesion in the right iliac bone likely indicating a benign lesion. Electronically Signed   By: Burman NievesWilliam  Stevens M.D.   On: 03/05/2018 05:06    Procedures Procedures (including critical care  time)  Medications Ordered in ED Medications  sodium chloride 0.9 % bolus 1,000 mL (0 mLs Intravenous Stopped 03/05/18 0521)     Initial Impression / Assessment and Plan / ED Course  I have reviewed the triage vital signs and the nursing notes.  Pertinent labs & imaging results that were available during my care of the patient were reviewed by me and considered in my medical decision making (see chart for details).     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  By history this sounds like nephrolithiasis.  I discussed different options for possible diagnosis and the patient elects for CT scan.  We will give a fluid bolus.  He is currently pain-free and not requesting any medicines.  CT scan with a 2 mm right distal ureter stone.  Patient continues to be pain-free.  UA is negative for infection.  Renal function at baseline, no anemia.  Trace blood in the urine.  Discharge home.  Urology follow-up.  6:43 AM:  I have discussed the diagnosis/risks/treatment options with the patient and believe the pt to be eligible for discharge home to follow-up with Urology. We also discussed returning to the ED immediately if new or worsening sx occur. We discussed the sx which are most concerning (e.g., sudden worsening pain, fever, inability to tolerate by mouth) that necessitate immediate return. Medications administered to the patient during their visit and any new prescriptions provided to the patient are listed below.  Medications given during this visit Medications  sodium chloride 0.9 % bolus 1,000 mL (0 mLs Intravenous Stopped 03/05/18 0521)     The patient appears reasonably screen and/or stabilized for discharge and I doubt any other medical condition or other [facility] requiring further screening, evaluation, or treatment in the ED at this time prior to discharge.    Final Clinical Impressions(s) / ED Diagnoses   Final diagnoses:   Nephrolithiasis    ED Discharge Orders         Ordered    morphine (MSIR) 15 MG tablet  Every 4 hours PRN     03/05/18 0518    ondansetron (ZOFRAN ODT) 4 MG disintegrating tablet  Every 8 hours PRN     03/05/18 0518    tamsulosin (FLOMAX) 0.4 MG CAPS capsule  Daily after supper     03/05/18 0518           Melene PlanFloyd, Natasja Niday, DO 03/05/18 [phone]

## 2018-03-05 NOTE — Discharge Instructions (Signed)
Take tylenol and ibuprofen regularly and then take the pain med if you need to.  Follow up with the urologist.

## 2018-04-01 MED FILL — PANTOPRAZOLE SOD DR 40 MG T: 40 | 90 days supply | Qty: 90 | Fill #1

## 2018-06-29 MED FILL — PANTOPRAZOLE SOD DR 40 MG T: 40 | 90 days supply | Qty: 90 | Fill #2

## 2018-07-10 MED FILL — AMOX-CLAV 875-125 MG TABLET: 875-125 | 10 days supply | Qty: 20 | Fill #1

## 2018-07-12 MED FILL — valACYclovir HCL 1 GM TABS: 1 | 10 days supply | Qty: 20 | Fill #0

## 2018-09-15 MED FILL — PANTOPRAZOLE SOD DR 40 MG T: 40 | 90 days supply | Qty: 90 | Fill #3 | Status: TO

## 2018-12-06 MED FILL — PANTOPRAZOLE SOD DR 40 MG T: 40 | 90 days supply | Qty: 90 | Fill #0

## 2019-01-04 ENCOUNTER — Other Ambulatory Visit: Payer: Self-pay

## 2019-01-04 ENCOUNTER — Ambulatory Visit (INDEPENDENT_AMBULATORY_CARE_PROVIDER_SITE_OTHER): Payer: 59 | Admitting: Family Medicine

## 2019-01-04 ENCOUNTER — Encounter: Payer: Self-pay | Admitting: Family Medicine

## 2019-01-04 VITALS — BP 128/86 | HR 85 | Temp 98.4°F | Ht 73.0 in | Wt 325.4 lb

## 2019-01-04 DIAGNOSIS — Z114 Encounter for screening for human immunodeficiency virus [HIV]: Secondary | ICD-10-CM

## 2019-01-04 DIAGNOSIS — R110 Nausea: Secondary | ICD-10-CM | POA: Diagnosis not present

## 2019-01-04 DIAGNOSIS — R739 Hyperglycemia, unspecified: Secondary | ICD-10-CM | POA: Diagnosis not present

## 2019-01-04 DIAGNOSIS — E785 Hyperlipidemia, unspecified: Secondary | ICD-10-CM

## 2019-01-04 DIAGNOSIS — Z0000 Encounter for general adult medical examination without abnormal findings: Secondary | ICD-10-CM | POA: Diagnosis not present

## 2019-01-04 DIAGNOSIS — K219 Gastro-esophageal reflux disease without esophagitis: Secondary | ICD-10-CM | POA: Diagnosis not present

## 2019-01-04 DIAGNOSIS — Z79899 Other long term (current) drug therapy: Secondary | ICD-10-CM

## 2019-01-04 MED ORDER — PANTOPRAZOLE SODIUM 40 MG PO TBEC: 40.0000 mg | DELAYED_RELEASE_TABLET | Freq: Every day | ORAL | 3 refills | Status: DC

## 2019-01-04 NOTE — Patient Instructions (Addendum)
Sign release of information at the check out desk for last colonoscopy from Dr. Collene Mares  We will call you within two weeks about your referral to Dr. Hassell Done with [facility]. If you do not hear within 3 weeks, give [place] a call.   Please stop by lab before you go If you do not have mychart- we will call you about results within 5 business days of [place] receiving them.  If you have mychart- we will send your results within 3 business days of [place] receiving them.  If abnormal or we want to clarify a result, we will call or mychart you to make sure you receive the message.  If you have questions or concerns or don't hear within 5-7 days, please send [place] a message or call us.

## 2019-01-04 NOTE — Progress Notes (Signed)
Phone: [phone]    Subjective:  Patient presents today for their annual physical. Chief complaint-noted.   See problem oriented charting- ROS- full  review of systems was completed and negative except for: some shortness of breath when deconditioned- resolves with exercise  The following were reviewed and entered/updated in epic: Past Medical History:  Diagnosis Date  . Fatty liver   . Hiatal hernia   . OSA (obstructive sleep apnea)    Patient Active Problem List   Diagnosis Date Noted  . Bone cyst, solitary 05/24/2015    Priority: Medium  . Hyperglycemia 05/24/2015    Priority: Medium  . Fatty liver     Priority: Medium  . OSA (obstructive sleep apnea) 01/01/2015    Priority: Medium  . Nausea without vomiting 05/24/2015    Priority: Low  . GERD (gastroesophageal reflux disease) 05/24/2015    Priority: Low  . Family history of colon cancer 05/24/2015    Priority: Low  . DOE (dyspnea on exertion) 12/02/2015   Past Surgical History:  Procedure Laterality Date  . broken nose    . ct guided biopsy     cystic lesion- nondiagnostic but followed radiographically    Family History  Problem Relation Age of Onset  . Diabetes Mother        26s  . Hypertension Mother   . CVA Mother        88  . Colon cancer Father        mid 38s  . Liver cancer Father        decesed- age 25. NASH etiology  . Diabetes Father        42s  . Hypertension Father     Medications- reviewed and updated Current Outpatient Medications  Medication Sig Dispense Refill  . pantoprazole (PROTONIX) 40 MG tablet Take 1 tablet (40 mg total) by mouth daily. 90 tablet 3   No current facility-administered medications for this visit.     Allergies-reviewed and updated No Known Allergies  Social History   Social History Narrative   Married. 3 children 6.5 Iylah (sounds like isla)  and 62.45 years old [place] and 3 Naya in 2020.    Wife Magda is my patient.       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 Oropharynx normal Neck: no thyromegaly or cervical lymphandenopathy CV: RRR no murmurs rubs or gallops Lungs: CTAB no crackles, wheeze, rhonchi Abdomen: soft/nontender/nondistended/normal bowel sounds. No rebound or guarding.  Ext: no edema Skin: warm, dry Neuro: grossly normal, moves all extremities, PERRLA     Assessment and Plan:  43 y.o. male presenting for annual physical.  Health Maintenance counseling: 1. Anticipatory guidance: Patient counseled regarding regular dental exams -q6 months, eye exams -no issues,  avoiding smoking and second hand smoke , limiting alcohol to 2 beverages per day- no alcohol.   2. Risk factor reduction:  Advised patient of need for regular  exercise and diet rich and fruits and vegetables to reduce risk of heart attack and stroke. Last year had 3 gym memberships but hard to go. Has tried keto diet and Noom app.  Logging food 4-5 days and stopped. Schedule at work makes it hard for him. Wants referral to Dr. Daphine DeutscherMartin for weight loss surgeyr.  Wt Readings from Last 3 Encounters:  01/04/19 (!) 325 lb 6.4 oz (147.6 kg)  03/05/18 (!) 310 lb (140.6 kg)  11/30/15 (!) 312 lb (141.5 kg)  3. Immunizations/screenings/ancillary studies- offered hiv screen - opts in Immunization History  Administered Date(s) Administered  . Influenza-Unspecified 04/30/2015  . Tdap 05/23/2012  4. Prostate cancer screening- no family history, start at age 45 or 355  5. Colon cancer screening - 5 year repeat planned due to family history- has had within a few  years- we will get a copy to put in our records 6. Skin cancer screening/prevention- no dermatologist. advised regular sunscreen use. Denies worrisome, changing, or new skin lesions.  7. Testicular cancer screening- advised monthly self exams  8. STD screening- patient opts out 9. Never smoker-   Status of chronic or acute concerns  GERD - taking Pantoprazole 40 mg daily. Occasional breakthrough pepcid. Since needing  Fatty liver- dad died of liver cancer. Also has hiatal hernia. For fatty liver really wants to lose weight. For hiatal hernia wants to ask Dr. Daphine DeutscherMartin about surgery for this  Nephrolithiasis - was seen in the ED 03/05/18 and prescribed Flomax 0.4 mg daily, Morphine and Zofran - has has completed these medications. He reports not recurrence of sx since then. He passed the stone.   Hyperlipidemia- update lipids- weight loss and potential weight loss sugery will help  Morbid obesity- has tried multiple ways to lose weight without success- wants to sit down and talk with Dr. Daphine DeutscherMartin about potential weight los surgery- referred today  Lab/Order associations: not fasting- cheese sandwich   ICD-10-CM   1. Preventative health care  Z00.00 CBC    Comprehensive metabolic panel    Lipid panel    Hemoglobin A1c    TSH    HIV Antibody (routine testing w rflx)  2. Nausea without vomiting  R11.0   3. Gastroesophageal reflux disease without esophagitis  K21.9   4. Morbid obesity (HCC)  E66.01 Ambulatory referral to General Surgery  5. Screening for HIV (human immunodeficiency virus)  Z11.4 HIV Antibody (routine testing w rflx)  6. Hyperglycemia  R73.9 Hemoglobin A1c  7. Hyperlipidemia, unspecified hyperlipidemia type  E78.5 CBC    Comprehensive metabolic panel    Lipid panel    TSH  8. High risk medication use  Z79.899 Vitamin B12    Meds ordered this encounter  Medications  . pantoprazole (PROTONIX) 40 MG tablet    Sig: Take 1 tablet (40 mg total) by mouth daily.    Dispense:  90  tablet    Refill:  3   Return precautions advised.  Tana ConchStephen Merari Pion, MD

## 2019-01-05 LAB — VITAMIN B12: Vitamin B-12: 385 pg/mL (ref 211–911)

## 2019-01-05 LAB — CBC
HCT: 42.3 % (ref 39.0–52.0)
Hemoglobin: 14.5 g/dL (ref 13.0–17.0)
MCHC: 34.4 g/dL (ref 30.0–36.0)
MCV: 87.1 fl (ref 78.0–100.0)
Platelets: 224 10*3/uL (ref 150.0–400.0)
RBC: 4.86 Mil/uL (ref 4.22–5.81)
RDW: 13.1 % (ref 11.5–15.5)
WBC: 8.2 10*3/uL (ref 4.0–10.5)

## 2019-01-05 LAB — HIV ANTIBODY (ROUTINE TESTING W REFLEX): HIV 1&2 Ab, 4th Generation: NONREACTIVE

## 2019-01-05 LAB — TSH: TSH: 1.75 u[IU]/mL (ref 0.35–4.50)

## 2019-01-05 LAB — COMPREHENSIVE METABOLIC PANEL
ALT: 46 U/L (ref 0–53)
AST: 25 U/L (ref 0–37)
Albumin: 4.5 g/dL (ref 3.5–5.2)
Alkaline Phosphatase: 70 U/L (ref 39–117)
BUN: 14 mg/dL (ref 6–23)
CO2: 29 mEq/L (ref 19–32)
Calcium: 9.8 mg/dL (ref 8.4–10.5)
Chloride: 104 mEq/L (ref 96–112)
Creatinine, Ser: 0.75 mg/dL (ref 0.40–1.50)
GFR: 136.5 mL/min (ref >60.00)
Glucose, Bld: 76 mg/dL (ref 70–99)
Potassium: 4.1 mEq/L (ref 3.5–5.1)
Sodium: 142 mEq/L (ref 135–145)
Total Bilirubin: 0.5 mg/dL (ref 0.2–1.2)
Total Protein: 6.9 g/dL (ref 6.0–8.3)

## 2019-01-05 LAB — LIPID PANEL
Cholesterol: 180 mg/dL (ref 0–200)
HDL: 36.1 mg/dL — ABNORMAL LOW (ref >39.00)
LDL Cholesterol: 114 mg/dL — ABNORMAL HIGH (ref 0–99)
NonHDL: 143.96
Total CHOL/HDL Ratio: 5
Triglycerides: 150 mg/dL — ABNORMAL HIGH (ref 0.0–149.0)
VLDL: 30 mg/dL (ref 0.0–40.0)

## 2019-01-05 LAB — HEMOGLOBIN A1C: Hgb A1c MFr Bld: 5.8 % (ref 4.6–6.5)

## 2019-02-23 MED FILL — PANTOPRAZOLE SOD DR 40 MG T: 40 | 90 days supply | Qty: 90 | Fill #0

## 2019-03-02 ENCOUNTER — Encounter: Payer: Self-pay | Admitting: Family Medicine

## 2019-03-03 ENCOUNTER — Other Ambulatory Visit: Payer: Self-pay | Admitting: Surgery

## 2019-03-03 ENCOUNTER — Other Ambulatory Visit (HOSPITAL_COMMUNITY): Payer: Self-pay | Admitting: Surgery

## 2019-03-14 ENCOUNTER — Ambulatory Visit (HOSPITAL_COMMUNITY)
Admission: RE | Admit: 2019-03-14 | Discharge: 2019-03-14 | Disposition: A | Discharge status: Home / Self Care | Payer: 59 | Source: Ambulatory Visit | Attending: Surgery | Admitting: Surgery

## 2019-03-14 ENCOUNTER — Other Ambulatory Visit: Payer: Self-pay

## 2019-03-14 DIAGNOSIS — K449 Diaphragmatic hernia without obstruction or gangrene: Secondary | ICD-10-CM | POA: Diagnosis not present

## 2019-03-14 DIAGNOSIS — Z01818 Encounter for other preprocedural examination: Secondary | ICD-10-CM | POA: Diagnosis not present

## 2019-03-24 ENCOUNTER — Encounter: Payer: Self-pay | Admitting: Skilled Nursing Facility1

## 2019-03-24 ENCOUNTER — Other Ambulatory Visit: Payer: Self-pay

## 2019-03-24 ENCOUNTER — Encounter: Payer: 59 | Attending: Surgery | Admitting: Skilled Nursing Facility1

## 2019-03-24 DIAGNOSIS — E669 Obesity, unspecified: Secondary | ICD-10-CM | POA: Diagnosis not present

## 2019-03-24 NOTE — Progress Notes (Signed)
Pre-Op Assessment Visit:  Pre-Operative Sleeve Gastrecomy Surgery  Medical Nutrition Therapy:  Appt start time: 10:00  End time:  10:30  Patient was seen on 03/24/2019 for Pre-Operative Nutrition Assessment. Assessment and letter of approval faxed to [facility] Bariatric Surgery Program coordinator on 03/24/2019.    Referral Stated [facility] Appointments Required: 0  Proposed Surgery Type: sleeve gastrectomy   Pt expectation of surgery: to lose weight  Pt expectation of dietitian: to help prepare me   NUTRITION ASSESSMENT   Anthropometrics  Start weight at [facility]: 340.5 lbs Today's weight: 340.5 lbs BMI: 44.92 kg/m2     Psychosocial/Lifestyle Dx: sleep apnea, GERD, fatty liver  Pt states he does not wear his C-PAP knowing the ramifications of that.    Medications: pantoprezole  Labs:   Notable Signs/Symptoms    24-Hr Dietary Recall First Meal: skipped Snack:  Second Meal: philly cheese steak and fries  Snack: desserts Third Meal: fried chicken and fries Snack: desserts Beverages: water, tea, coffee    Physical Activity  ADL's   Estimated Energy Needs Calories:  Carbohydrate:  Protein:  Fat:    NUTRITION DIAGNOSIS  Overweight/obesity ([place]-3.3) related to past poor dietary habits and physical inactivity as evidenced by patient w/ planned Sleeve surgery following dietary guidelines for continued weight loss.    NUTRITION INTERVENTION  Nutrition counseling (C-1) and education (E-2) to facilitate bariatric surgery goals.   Handouts given during visit include:  . Pre-Op Goals . Bariatric Surgery Protein Shakes . Vitamin and Mineral Options    During the appointment today the following Pre-Op Goals were reviewed with the patient: . Log your food and beverage via an app or pen and paper  . Make healthy food choices . Begin to limit portion sizes . Limited concentrated sugars and fried foods . Keep fat/sugar in the single digits per serving on               food labels . Practice CHEWING your food  (aim for 30 chews per bite or until applesauce consistency) . Practice not drinking 15 minutes before, during, and 30 minutes after each meal/snack . Avoid all carbonated beverages  . Avoid/limit caffeinated beverages  . Avoid all sugar-sweetened beverages . Consume 3 meals per day; eat every 3-5 hours . Make a list of non-food related activities . Aim for 64-100 ounces of FLUID daily  . Aim for at least 60-80 grams of PROTEIN daily . Look for a liquid protein source that contain ?15 g protein and ?5 g carbohydrate  (ex: shakes, drinks, shots)   Change readiness: Pre contemplative   Demonstrated degree of understanding via: Teach Back      MONITORING & EVALUATION Dietary intake, weekly physical activity, body weight, and pre-op goals reached.    Next Steps  Patient is to call [facility] (once surgery date is scheduled) to be scheduled for Pre-Op Class, which must be at least 2 weeks prior to surgery date or back follow up visit in 1 month

## 2019-04-11 ENCOUNTER — Ambulatory Visit: Payer: 59

## 2019-05-09 ENCOUNTER — Ambulatory Visit (INDEPENDENT_AMBULATORY_CARE_PROVIDER_SITE_OTHER): Payer: 59 | Admitting: Psychology

## 2019-05-18 ENCOUNTER — Ambulatory Visit: Payer: 59 | Admitting: Psychology

## 2019-05-20 MED FILL — PANTOPRAZOLE SOD DR 40 MG T: 40 | 90 days supply | Qty: 90 | Fill #1

## 2019-06-07 ENCOUNTER — Encounter: Payer: 59 | Attending: Surgery | Admitting: Skilled Nursing Facility1

## 2019-06-07 ENCOUNTER — Other Ambulatory Visit: Payer: Self-pay

## 2019-06-07 DIAGNOSIS — E669 Obesity, unspecified: Secondary | ICD-10-CM | POA: Diagnosis not present

## 2019-06-07 NOTE — Progress Notes (Signed)
Supervised Weight Loss Visit Bariatric Nutrition Education Appt Start Time: 11:30   End Time: 12:00  Planned Surgery: Sleeve  Pt Expectation of Surgery/ Goals: Weight Loss  Pt is here today by the direction of Dr. Ceasar Lund stating it would benefit the pt to work on more lifestyle and dietary changes before surgery Dr. Ceasar Lund communicated pt is not experiencing psychological bouts during times of binge eating at night. Pt has not been diagnosed with BED. Pt states he is able to, and has previously weaned himself off of caffeine leading up to Ramadan. This proves decreasing caffeine intake leading up to surgery will not be a problem. Pt responds well to idea of behavior changes pre-surgery in order to be successful after surgery. 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 Pt reports his wife urges him to eat salads, which she prepares for him.  NUTRITION ASSESSMENT  Anthropometrics  Start weight at [facility]: 340.5 lbs (date: 03/24/2019) Today's weight: 349 lbs Weight change: +9 lbs (since previous visit on 03/24/2019) BMI: 46.04 kg/m2    Clinical  Medical Hx: sleep apnea, fatty liver, GERD Medications: protonix  Labs:  Notable Signs/Symptoms:   Lifestyle & Dietary Hx  Pt arrives having gained about 9 pounds.  Pt states he is now wearing his C-PAP every night.  Pt states since his last visit he has tried not to eat late at night.  Pt states he now makes an effort to always have non starchy vegetables with his meals.  Goes to sleep at 10 or 11. Wakes at 5:30  Pt reports asking for smaller portions when ordering food at hospital.  Estimated daily fluid intake:48-50 oz of water, 4 cups of coffee daily (unknown oz) Supplements: N/A GI / other notable symptoms: GERD (Controlled with Protonix) Current average weekly physical activity: ADLs  24-Hr Dietary Recall First Meal: skipped 4 cups of coffee Snack: none Second Meal: hospital cafeteria: rice biscuit and  non starchy vegetables Snack: mayo based salads or a cheese and crackers and nuts  Third Meal 7-8pm: hamburger and fry Snack: almond biscuit and salad with meat: lettuce, peppers, onions, arugula  Beverages: coffee, water  Estimated Energy Needs Calories: 2200 Carbohydrate: 248g Protein: 165g Fat: 61g   NUTRITION DIAGNOSIS  Overweight/obesity ([place]-3.3) related to past poor dietary habits and physical inactivity as evidenced by patient w/ planned sleeve surgery following dietary guidelines for continued weight loss.   NUTRITION INTERVENTION  Nutrition counseling (C-1) and education (E-2) to facilitate bariatric surgery goals.  Pre-Op Goals Progress & New Goals . Remove 1 snack between lunch and dinner . Add protein shake for breakfast, preferably in place of coffee. . Continue to aim for a minimum of 64 oz fluid. . Continue to reduce portion size at lunch  Handouts Provided Include   N/A  Learning Style & Readiness for Change Teaching method utilized: Visual & Auditory  Demonstrated degree of understanding via: Teach Back  Barriers to learning/adherence to lifestyle change: Work schedule/deference of self worth.  RD's Notes for next Visit  Check in with pt's adherence to pt chosen goals. Ensure the focus is on post surgery habits and maintainable changes.  MONITORING & EVALUATION Dietary intake, weekly physical activity, body weight, and pre-op goals in 2 weeks.   Next Steps  Patient is to return to [facility] in 2 weeks for follow up.

## 2019-06-28 ENCOUNTER — Ambulatory Visit: Payer: 59 | Admitting: Skilled Nursing Facility1

## 2019-07-18 ENCOUNTER — Other Ambulatory Visit: Payer: Self-pay

## 2019-07-18 ENCOUNTER — Encounter: Payer: 59 | Attending: Surgery | Admitting: Skilled Nursing Facility1

## 2019-07-18 DIAGNOSIS — E669 Obesity, unspecified: Secondary | ICD-10-CM | POA: Diagnosis not present

## 2019-07-18 NOTE — Progress Notes (Signed)
Supervised Weight Loss Visit Bariatric Nutrition Education Appt Start Time: 11:30   End Time: 12:00  Planned Surgery: Sleeve  Pt Expectation of Surgery/ Goals: Weight Loss   NUTRITION ASSESSMENT  Anthropometrics  Start weight at [facility]: 340.5 lbs (date: 03/24/2019) Today's weight: 345 lbs Weight change: -4 lbs (since previous visit on 03/24/2019) BMI: 45.53 kg/m2    Clinical  Medical Hx: sleep apnea, fatty liver, GERD Medications: protonix  Labs:  Notable Signs/Symptoms:   Lifestyle & Dietary Hx  Pt arrives having lost about 4 pounds since his last visit. Pt states eating throughout the day and not skipping meals/snacks has helped him to not over snack at night. Pt states he will cut his hours down to full time in February realizing he wants to enjoy life and not just work all the time.    Estimated daily fluid intake:48-50 oz of water, 4 cups of coffee daily (unknown oz) Supplements: N/A GI / other notable symptoms: GERD (Controlled with Protonix) Current average weekly physical activity: ADLs  24-Hr Dietary Recall First Meal 6-6:30: premier protein  Snack 9:30-10: fruit (2-3 servings at once) or eggs Second Meal 12:30-vegetables with less of the meat  Snack 1-2 hours later: yogurt Snack: carrots and celery with hummus Snack: premier protein  Third Meal 7-8pm: salad or soup with chicken or salmon Snack: 1-2 apples  Beverages: lessened coffee, water  Estimated Energy Needs Calories: 2200 Carbohydrate: 248g Protein: 165g Fat: 61g   NUTRITION DIAGNOSIS  Overweight/obesity ([place]-3.3) related to past poor dietary habits and physical inactivity as evidenced by patient w/ planned sleeve surgery following dietary guidelines for continued weight loss.   NUTRITION INTERVENTION  Nutrition counseling (C-1) and education (E-2) to facilitate bariatric surgery goals.  Pre-Op Goals Progress & New Goals . Remove 1 snack between lunch and dinner . Add protein shake for breakfast,  preferably in place of coffee. . Continue to aim for a minimum of 64 oz fluid. . Continue to reduce portion size at lunch . Keep up the great work!  Handouts Provided Include   N/A  Learning Style & Readiness for Change Teaching method utilized: Visual & Auditory  Demonstrated degree of understanding via: Teach Back  Barriers to learning/adherence to lifestyle change: none identified   RD's Notes for next Visit  Check in with pt's adherence to pt chosen goals. Ensure the focus is on post surgery habits and maintainable changes.  MONITORING & EVALUATION Dietary intake, weekly physical activity, body weight, and pre-op goals  Next Steps  Patient is to return to [facility] for pre-op class at minimum 2 weeks before surgery date

## 2019-08-10 ENCOUNTER — Ambulatory Visit (INDEPENDENT_AMBULATORY_CARE_PROVIDER_SITE_OTHER): Payer: 59 | Admitting: Psychology

## 2019-08-10 DIAGNOSIS — F509 Eating disorder, unspecified: Secondary | ICD-10-CM

## 2019-08-10 MED FILL — PANTOPRAZOLE SOD DR 40 MG T: 40 | 90 days supply | Qty: 90 | Fill #2

## 2019-09-05 ENCOUNTER — Ambulatory Visit: Payer: 59

## 2019-09-12 ENCOUNTER — Encounter: Payer: 59 | Attending: Surgery | Admitting: Skilled Nursing Facility1

## 2019-09-12 ENCOUNTER — Other Ambulatory Visit: Payer: Self-pay

## 2019-09-12 DIAGNOSIS — E669 Obesity, unspecified: Secondary | ICD-10-CM | POA: Insufficient documentation

## 2019-09-12 NOTE — Progress Notes (Signed)
Pre-Operative Nutrition Class:  Appt start time: 0156   End time:  1830.  Patient was seen on 09/12/19 for Pre-Operative Bariatric Surgery Education at [facility].   Surgery date:  Surgery type: sleeve Start weight at [facility]: 340.5 Weight today: 355  Samples given per MNT protocol. Patient educated on appropriate usage: Bariatric Advantage Multivitamin Lot # [id number] Exp:08/21  Bariatric Advantage Calcium  Lot # [id number] Exp: 07/23/2020   Protein Shake Lot # [id number] Exp: 12/05/2020  The following the learning objectives were met by the patient during this course:  Identify Pre-Op Dietary Goals and will begin 2 weeks pre-operatively  Identify appropriate sources of fluids and proteins   State protein recommendations and appropriate sources pre and post-operatively  Identify Post-Operative Dietary Goals and will follow for 2 weeks post-operatively  Identify appropriate multivitamin and calcium sources  Describe the need for physical activity post-operatively and will follow MD recommendations  State when to call healthcare provider regarding medication questions or post-operative complications  Handouts given during class include:  Pre-Op Bariatric Surgery Diet Handout  Protein Shake Handout  Post-Op Bariatric Surgery Nutrition Handout  [facility] Program Information Flyer  Support Group Information Flyer  [facility] Bariatric Supplements Price List  Follow-Up Plan: Patient will follow-up at [facility] 2 weeks post operatively for diet advancement per MD.

## 2019-09-21 DIAGNOSIS — Z01818 Encounter for other preprocedural examination: Secondary | ICD-10-CM | POA: Diagnosis not present

## 2019-10-04 NOTE — Progress Notes (Signed)
Please place surgery orders. Pt is scheduled for PAT on 10-05-19.

## 2019-10-04 NOTE — Progress Notes (Signed)
PCP - Tana Conch, MD Cardiologist -   Chest x-ray - 03-14-19  EKG - 03-14-19  Stress Test -  ECHO - 11-30-15  Cardiac Cath -   Sleep Study -  CPAP -   Fasting Blood Sugar -  Checks Blood Sugar _____ times a day  Blood Thinner Instructions: Aspirin Instructions: Last Dose:  Anesthesia review:   Patient denies shortness of breath, fever, cough and chest pain at PAT appointment   Patient verbalized understanding of instructions that were given to them at the PAT appointment. Patient was also instructed that they will need to review over the PAT instructions again at home before surgery.

## 2019-10-04 NOTE — Patient Instructions (Addendum)
DUE TO COVID-19 ONLY ONE VISITOR IS ALLOWED TO COME WITH YOU AND STAY IN THE WAITING ROOM ONLY DURING PRE OP AND PROCEDURE DAY OF SURGERY. THE 1 VISITOR MAY VISIT WITH YOU AFTER SURGERY IN YOUR PRIVATE ROOM DURING VISITING HOURS ONLY!  YOU NEED TO HAVE A COVID 19 TEST ON 10-07-19 @ 2:25 PM, THIS TEST MUST BE DONE BEFORE SURGERY, COME  [place], [place] , [id number].  ([place]) ONCE YOUR COVID TEST IS COMPLETED, PLEASE BEGIN THE QUARANTINE INSTRUCTIONS AS OUTLINED IN YOUR HANDOUT.                Charles David  10/04/2019   Your procedure is scheduled on: 10-11-19   Report to [facility] Main  Entrance    Report to Short Stay at 5:30 AM     Call this number if you have problems the morning of surgery [phone]    Remember :MORNING OF SURGERY DRINK:   DRINK 1 G2 drink BEFORE YOU LEAVE HOME, DRINK ALL OF THE  G2 DRINK AT ONE TIME.  NO SOLID FOOD AFTER 600 PM THE NIGHT BEFORE YOUR SURGERY. YOU MAY DRINK CLEAR FLUIDS. THE G2 DRINK YOU DRINK BEFORE YOU LEAVE HOME WILL BE THE LAST FLUIDS YOU DRINK BEFORE SURGERY.   CLEAR LIQUID DIET   Foods Allowed                                                                     Foods Excluded  Coffee and tea, regular and decaf                             liquids that you cannot  Plain Jell-O any favor except red or purple                                           see through such as: Fruit ices (not with fruit pulp)                                     milk, soups, orange juice  Iced Popsicles                                    All solid food Carbonated beverages, regular and diet                                    Cranberry, grape and apple juices Sports drinks like Gatorade Lightly seasoned clear broth or consume(fat free) Sugar, honey syrup    Take these medicines the morning of surgery with A SIP OF WATER: Pantoprazole (Protonix)    BRUSH YOUR TEETH MORNING OF SURGERY AND RINSE YOUR MOUTH OUT, NO  CHEWING GUM CANDY OR MINTS.  You may not have any metal on your body including hair pins and              piercings    Do not wear jewelry, cologne, lotions, powders or deodorant                      Men may shave face and neck.   Do not bring valuables to the hospital. [facility] IS NOT             RESPONSIBLE   FOR VALUABLES.  Contacts, dentures or bridgework may not be worn into surgery.  You may bring an overnight bag     Special Instructions: N/A              Please read over the following fact sheets you were given: _____________________________________________________________________          PAIN IS EXPECTED AFTER SURGERY AND WILL NOT BE COMPLETELY ELIMINATED. AMBULATION AND TYLENOL WILL HELP REDUCE INCISIONAL AND GAS PAIN. MOVEMENT IS KEY!  YOU ARE EXPECTED TO BE OUT OF BED WITHIN 4 HOURS OF ADMISSION TO YOUR PATIENT ROOM.  SITTING IN THE RECLINER THROUGHOUT THE DAY IS IMPORTANT FOR DRINKING FLUIDS AND MOVING GAS THROUGHOUT THE GI TRACT.  COMPRESSION STOCKINGS SHOULD BE WORN [facility] STAY UNLESS YOU ARE WALKING.   INCENTIVE SPIROMETER SHOULD BE USED EVERY HOUR WHILE AWAKE TO DECREASE POST-OPERATIVE COMPLICATIONS SUCH AS PNEUMONIA.  WHEN DISCHARGED HOME, IT IS IMPORTANT TO CONTINUE TO WALK EVERY HOUR AND USE THE INCENTIVE SPIROMETER EVERY HOUR.    _____________________________________________________________________   [facility] - Preparing for Surgery Before surgery, you can play an important role.  Because skin is not sterile, your skin needs to be as free of germs as possible.  You can reduce the number of germs on your skin by washing with CHG (chlorahexidine gluconate) soap before surgery.  CHG is an antiseptic cleaner which kills germs and bonds with the skin to continue killing germs even after washing. Please DO NOT use if you have an allergy to CHG or antibacterial soaps.  If your skin becomes reddened/irritated  stop using the CHG and inform your nurse when you arrive at Short Stay. Do not shave (including legs and underarms) for at least 48 hours prior to the first CHG shower.  You may shave your face/neck. Please follow these instructions carefully:  1.  Shower with CHG Soap the night before surgery and the  morning of Surgery.  2.  If you choose to wash your hair, wash your hair first as usual with your  normal  shampoo.  3.  After you shampoo, rinse your hair and body thoroughly to remove the  shampoo.                           4.  Use CHG as you would any other liquid soap.  You can apply chg directly  to the skin and wash                       Gently with a scrungie or clean washcloth.  5.  Apply the CHG Soap to your body ONLY FROM THE NECK DOWN.   Do not use on face/ open                           Wound or open sores. Avoid contact with  eyes, ears mouth and genitals (private parts).                       Wash face,  Genitals (private parts) with your normal soap.             6.  Wash thoroughly, paying special attention to the area where your surgery  will be performed.  7.  Thoroughly rinse your body with warm water from the neck down.  8.  DO NOT shower/wash with your normal soap after using and rinsing off  the CHG Soap.                9.  Pat yourself dry with a clean towel.            10.  Wear clean pajamas.            11.  Place clean sheets on your bed the night of your first shower and do not  sleep with pets. Day of Surgery : Do not apply any lotions/deodorants the morning of surgery.  Please wear clean clothes to the hospital/surgery center.  FAILURE TO FOLLOW THESE INSTRUCTIONS MAY RESULT IN THE CANCELLATION OF YOUR SURGERY PATIENT SIGNATURE_________________________________  NURSE SIGNATURE__________________________________  ________________________________________________________________________

## 2019-10-05 ENCOUNTER — Encounter (HOSPITAL_COMMUNITY): Payer: Self-pay

## 2019-10-05 ENCOUNTER — Encounter (HOSPITAL_COMMUNITY)
Admission: RE | Admit: 2019-10-05 | Discharge: 2019-10-05 | Disposition: A | Discharge status: Home / Self Care | Payer: 59 | Source: Ambulatory Visit | Attending: Surgery | Admitting: Surgery

## 2019-10-05 ENCOUNTER — Other Ambulatory Visit: Payer: Self-pay

## 2019-10-05 DIAGNOSIS — Z01812 Encounter for preprocedural laboratory examination: Secondary | ICD-10-CM | POA: Insufficient documentation

## 2019-10-05 LAB — CBC
HCT: 43.2 % (ref 39.0–52.0)
Hemoglobin: 14.5 g/dL (ref 13.0–17.0)
MCH: 29.1 pg (ref 26.0–34.0)
MCHC: 33.6 g/dL (ref 30.0–36.0)
MCV: 86.6 fL (ref 80.0–100.0)
Platelets: 207 10*3/uL (ref 150–400)
RBC: 4.99 MIL/uL (ref 4.22–5.81)
RDW: 12.5 % (ref 11.5–15.5)
WBC: 9.4 10*3/uL (ref 4.0–10.5)
nRBC: 0 % (ref 0.0–0.2)

## 2019-10-05 LAB — BASIC METABOLIC PANEL
Anion gap: 10 (ref 5–15)
BUN: 16 mg/dL (ref 6–20)
CO2: 26 mmol/L (ref 22–32)
Calcium: 9.5 mg/dL (ref 8.9–10.3)
Chloride: 106 mmol/L (ref 98–111)
Creatinine, Ser: 0.79 mg/dL (ref 0.61–1.24)
GFR calc Af Amer: >60 mL/min (ref >60)
GFR calc non Af Amer: >60 mL/min (ref >60)
Glucose, Bld: 118 mg/dL — ABNORMAL HIGH (ref 70–99)
Potassium: 3.8 mmol/L (ref 3.5–5.1)
Sodium: 142 mmol/L (ref 135–145)

## 2019-10-07 ENCOUNTER — Other Ambulatory Visit (HOSPITAL_COMMUNITY)
Admission: RE | Admit: 2019-10-07 | Discharge: 2019-10-07 | Disposition: A | Discharge status: Home / Self Care | Payer: 59 | Source: Ambulatory Visit | Attending: Surgery | Admitting: Surgery

## 2019-10-07 DIAGNOSIS — Z01812 Encounter for preprocedural laboratory examination: Secondary | ICD-10-CM | POA: Diagnosis not present

## 2019-10-07 DIAGNOSIS — Z20822 Contact with and (suspected) exposure to covid-19: Secondary | ICD-10-CM | POA: Insufficient documentation

## 2019-10-07 LAB — SARS CORONAVIRUS 2 (TAT 6-24 HRS): SARS Coronavirus 2: NEGATIVE

## 2019-10-09 NOTE — H&P (Signed)
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  He does have some reflux and a slight hiatal hernia noted by Dr. Collene Mares on endoscopy.  He has tried many diets without sustained success.    Past Medical History:  Diagnosis Date  . Fatty liver   . GERD (gastroesophageal reflux disease)   . Hiatal hernia   . OSA (obstructive sleep apnea)     Past Surgical History:  Procedure Laterality Date  . broken nose    . ct guided biopsy     cystic lesion- nondiagnostic but followed radiographically    No current facility-administered medications for this encounter.   Current Outpatient Medications  Medication Sig Dispense Refill  . pantoprazole (PROTONIX) 40 MG tablet Take 1 tablet (40 mg total) by mouth daily. 90 tablet 3   Patient has no known allergies. Family History  Problem Relation Age of Onset  . Diabetes Mother        93s  . Hypertension Mother   . CVA Mother        75  . Colon cancer Father        mid 39s  . Liver cancer Father        decesed- age 56. NASH etiology  . Diabetes Father        45s  . Hypertension Father    Social History:   reports that he has never smoked. He has never used smokeless tobacco. He reports that he does not drink alcohol or use drugs.   REVIEW OF SYSTEMS : Negative except for see problem list  Physical Exam:   There were no vitals taken for this visit. There is no height or weight on file to calculate BMI.  Gen:  WDWN M NAD  Neurological: Alert and oriented to person, place, and time. Motor and sensory function is grossly intact  Head: Normocephalic and atraumatic.  Eyes: Conjunctivae are normal. Pupils are equal, round, and reactive to light. No scleral icterus.  Neck: Normal range of motion. Neck supple. No tracheal deviation or thyromegaly present.  Cardiovascular:  SR without murmurs or gallops.  No carotid bruits Breast:  Not  examined Respiratory: Effort normal.  No respiratory distress. No chest wall tenderness. Breath sounds normal.  No wheezes, rales or rhonchi.  Abdomen:  nontender GU:  Not examined Musculoskeletal: Normal range of motion. Extremities are nontender. No cyanosis, edema or clubbing noted Lymphadenopathy: No cervical, preauricular, postauricular or axillary adenopathy is present Skin: Skin is warm and dry. No rash noted. No diaphoresis. No erythema. No pallor. Pscyh: Normal mood and affect. Behavior is normal. Judgment and thought content normal.   LABORATORY RESULTS: Results for orders placed or performed during the hospital encounter of 10/07/19 (from the past 48 hour(s))  SARS CORONAVIRUS 2 (TAT 6-24 HRS) Nasopharyngeal Nasopharyngeal Swab     Status: None   Collection Time: 10/07/19 11:05 AM   Specimen: Nasopharyngeal Swab  Result Value Ref Range   SARS Coronavirus 2 NEGATIVE NEGATIVE    Comment: (NOTE) SARS-CoV-2 target nucleic acids are NOT DETECTED. The SARS-CoV-2 RNA is generally detectable in upper and lower respiratory specimens during the acute phase of infection. Negative results do not preclude SARS-CoV-2 infection, do not rule out co-infections with other pathogens, and should not be used as the sole basis for treatment or other patient management decisions. Negative results must be combined with clinical observations, patient history, and epidemiological information.  The expected result is Negative. Fact Sheet for Patients: HairSlick.no Fact Sheet for Healthcare Providers: quierodirigir.com This test is not yet approved or cleared by the [place] FDA and  has been authorized for detection and/or diagnosis of SARS-CoV-2 by FDA under an Emergency Use Authorization (EUA). This EUA will remain  in effect (meaning this test can be used) for the duration of the COVID-19 declaration under Section 56 4(b)(1) of the Act, 21  U.S.C. section 360bbb-3(b)(1), unless the authorization is terminated or revoked sooner. Performed at [facility], [address], [place], [place] [address]      RADIOLOGY RESULTS: No results found.  Problem List: Patient Active Problem List   Diagnosis Date Noted  . DOE (dyspnea on exertion) 12/02/2015  . Nausea without vomiting 05/24/2015  . GERD (gastroesophageal reflux disease) 05/24/2015  . Bone cyst, solitary 05/24/2015  . Hyperglycemia 05/24/2015  . Family history of colon cancer 05/24/2015  . Fatty liver   . OSA (obstructive sleep apnea) 01/01/2015    Assessment & Plan: Obesity (BMI ~ 44)  for sleeve gastrectomy.     Matt B. Daphine Deutscher, MD, [facility] [phone] beeper [phone]  10/09/2019 8:41 AM

## 2019-10-10 NOTE — Anesthesia Preprocedure Evaluation (Addendum)
Anesthesia Evaluation  Patient identified by MRN, date of birth, ID band Patient awake    Reviewed: Allergy & Precautions, NPO status , Patient's Chart, lab work & pertinent test results  Airway Mallampati: II  TM Distance: >3 FB Neck ROM: Full    Dental no notable dental hx.    Pulmonary neg pulmonary ROS,    Pulmonary exam normal breath sounds clear to auscultation       Cardiovascular + DOE  Normal cardiovascular exam Rhythm:Regular Rate:Normal     Neuro/Psych negative neurological ROS  negative psych ROS   GI/Hepatic Neg liver ROS, GERD  Medicated,  Endo/Other  Morbid obesity  Renal/GU negative Renal ROS  negative genitourinary   Musculoskeletal negative musculoskeletal ROS (+)   Abdominal   Peds negative pediatric ROS (+)  Hematology negative hematology ROS (+)   Anesthesia Other Findings   Reproductive/Obstetrics negative OB ROS                            Anesthesia Physical Anesthesia Plan  ASA: III  Anesthesia Plan: General   Post-op Pain Management:    Induction: Intravenous  PONV Risk Score and Plan: 3 and Ondansetron, Dexamethasone and Treatment may vary due to age or medical condition  Airway Management Planned: Oral ETT  Additional Equipment:   Intra-op Plan:   Post-operative Plan: Extubation in OR  Informed Consent: I have reviewed the patients History and Physical, chart, labs and discussed the procedure including the risks, benefits and alternatives for the proposed anesthesia with the patient or authorized representative who has indicated his/her understanding and acceptance.     Dental advisory given  Plan Discussed with: CRNA and Surgeon  Anesthesia Plan Comments:         Anesthesia Quick Evaluation

## 2019-10-11 ENCOUNTER — Inpatient Hospital Stay (HOSPITAL_COMMUNITY)
Admission: RE | Admit: 2019-10-11 | Discharge: 2019-10-12 | DRG: 621 | Disposition: A | Discharge status: Home / Self Care | Payer: 59 | Attending: Surgery | Admitting: Surgery

## 2019-10-11 ENCOUNTER — Encounter (HOSPITAL_COMMUNITY): Payer: Self-pay | Admitting: Surgery

## 2019-10-11 ENCOUNTER — Inpatient Hospital Stay (HOSPITAL_COMMUNITY): Payer: 59 | Admitting: Physician Assistant

## 2019-10-11 ENCOUNTER — Inpatient Hospital Stay (HOSPITAL_COMMUNITY): Payer: 59 | Admitting: Anesthesiology

## 2019-10-11 ENCOUNTER — Encounter (HOSPITAL_COMMUNITY)
Admission: RE | Disposition: A | Discharge status: Home / Self Care | Payer: Self-pay | Source: Home / Self Care | Attending: Surgery

## 2019-10-11 ENCOUNTER — Other Ambulatory Visit: Payer: Self-pay

## 2019-10-11 DIAGNOSIS — K449 Diaphragmatic hernia without obstruction or gangrene: Secondary | ICD-10-CM | POA: Diagnosis present

## 2019-10-11 DIAGNOSIS — Z833 Family history of diabetes mellitus: Secondary | ICD-10-CM

## 2019-10-11 DIAGNOSIS — Z823 Family history of stroke: Secondary | ICD-10-CM

## 2019-10-11 DIAGNOSIS — G4733 Obstructive sleep apnea (adult) (pediatric): Secondary | ICD-10-CM | POA: Diagnosis present

## 2019-10-11 DIAGNOSIS — K760 Fatty (change of) liver, not elsewhere classified: Secondary | ICD-10-CM | POA: Diagnosis present

## 2019-10-11 DIAGNOSIS — K219 Gastro-esophageal reflux disease without esophagitis: Secondary | ICD-10-CM | POA: Diagnosis present

## 2019-10-11 DIAGNOSIS — Z6841 Body Mass Index (BMI) 40.0 and over, adult: Secondary | ICD-10-CM

## 2019-10-11 DIAGNOSIS — Z9884 Bariatric surgery status: Secondary | ICD-10-CM

## 2019-10-11 DIAGNOSIS — Z800 Family history of malignant neoplasm of digestive organs: Secondary | ICD-10-CM | POA: Diagnosis not present

## 2019-10-11 DIAGNOSIS — Z8249 Family history of ischemic heart disease and other diseases of the circulatory system: Secondary | ICD-10-CM | POA: Diagnosis not present

## 2019-10-11 HISTORY — PX: LAPAROSCOPIC GASTRIC SLEEVE RESECTION: SHX5895

## 2019-10-11 LAB — COMPREHENSIVE METABOLIC PANEL
ALT: 48 U/L — ABNORMAL HIGH (ref 0–44)
AST: 44 U/L — ABNORMAL HIGH (ref 15–41)
Albumin: 4.2 g/dL (ref 3.5–5.0)
Alkaline Phosphatase: 67 U/L (ref 38–126)
Anion gap: 11 (ref 5–15)
BUN: 14 mg/dL (ref 6–20)
CO2: 22 mmol/L (ref 22–32)
Calcium: 8.7 mg/dL — ABNORMAL LOW (ref 8.9–10.3)
Chloride: 104 mmol/L (ref 98–111)
Creatinine, Ser: 0.77 mg/dL (ref 0.61–1.24)
GFR calc Af Amer: >60 mL/min (ref >60)
GFR calc non Af Amer: >60 mL/min (ref >60)
Glucose, Bld: 119 mg/dL — ABNORMAL HIGH (ref 70–99)
Potassium: 4.6 mmol/L (ref 3.5–5.1)
Sodium: 137 mmol/L (ref 135–145)
Total Bilirubin: 1.1 mg/dL (ref 0.3–1.2)
Total Protein: 7.1 g/dL (ref 6.5–8.1)

## 2019-10-11 LAB — HEMOGLOBIN AND HEMATOCRIT, BLOOD
HCT: 42.5 % (ref 39.0–52.0)
Hemoglobin: 14.5 g/dL (ref 13.0–17.0)

## 2019-10-11 LAB — CBC WITH DIFFERENTIAL/PLATELET
Abs Immature Granulocytes: 0.01 10*3/uL (ref 0.00–0.07)
Basophils Absolute: 0.1 10*3/uL (ref 0.0–0.1)
Basophils Relative: 1 %
Eosinophils Absolute: 0.2 10*3/uL (ref 0.0–0.5)
Eosinophils Relative: 2 %
HCT: 42.3 % (ref 39.0–52.0)
Hemoglobin: 14.1 g/dL (ref 13.0–17.0)
Immature Granulocytes: 0 %
Lymphocytes Relative: 39 %
Lymphs Abs: 3.2 10*3/uL (ref 0.7–4.0)
MCH: 28.7 pg (ref 26.0–34.0)
MCHC: 33.3 g/dL (ref 30.0–36.0)
MCV: 86 fL (ref 80.0–100.0)
Monocytes Absolute: 0.7 10*3/uL (ref 0.1–1.0)
Monocytes Relative: 9 %
Neutro Abs: 4.1 10*3/uL (ref 1.7–7.7)
Neutrophils Relative %: 49 %
Platelets: 204 10*3/uL (ref 150–400)
RBC: 4.92 MIL/uL (ref 4.22–5.81)
RDW: 12.5 % (ref 11.5–15.5)
WBC: 8.3 10*3/uL (ref 4.0–10.5)
nRBC: 0 % (ref 0.0–0.2)

## 2019-10-11 LAB — TYPE AND SCREEN
ABO/RH(D): B POS
Antibody Screen: NEGATIVE

## 2019-10-11 LAB — ABO/RH: ABO/RH(D): B POS

## 2019-10-11 SURGERY — GASTRECTOMY, SLEEVE, LAPAROSCOPIC
Anesthesia: General | Site: Abdomen

## 2019-10-11 MED ORDER — SUGAMMADEX SODIUM 500 MG/5ML IV SOLN
INTRAVENOUS | Status: DC | PRN
  Administered 2019-10-11: 500 mg via INTRAVENOUS

## 2019-10-11 MED ORDER — DEXAMETHASONE SODIUM PHOSPHATE 10 MG/ML IJ SOLN
INTRAMUSCULAR | Status: AC
  Filled 2019-10-11: qty 1

## 2019-10-11 MED ORDER — PROPOFOL 10 MG/ML IV BOLUS
INTRAVENOUS | Status: AC
  Filled 2019-10-11: qty 20

## 2019-10-11 MED ORDER — ROCURONIUM BROMIDE 100 MG/10ML IV SOLN
INTRAVENOUS | Status: DC | PRN
  Administered 2019-10-11: 30 mg via INTRAVENOUS
  Administered 2019-10-11: 10 mg via INTRAVENOUS
  Administered 2019-10-11 (×2): 20 mg via INTRAVENOUS
  Administered 2019-10-11: 50 mg via INTRAVENOUS
  Administered 2019-10-11: 20 mg via INTRAVENOUS

## 2019-10-11 MED ORDER — ACETAMINOPHEN 500 MG PO TABS
1000.0000 mg | ORAL_TABLET | ORAL | Status: AC
  Administered 2019-10-11: 1000 mg via ORAL
  Filled 2019-10-11: qty 2

## 2019-10-11 MED ORDER — LACTATED RINGERS IV SOLN: INTRAVENOUS | Status: DC

## 2019-10-11 MED ORDER — HEPARIN SODIUM (PORCINE) 5000 UNIT/ML IJ SOLN
5000.0000 [IU] | INTRAMUSCULAR | Status: AC
  Administered 2019-10-11: 5000 [IU] via SUBCUTANEOUS
  Filled 2019-10-11: qty 1

## 2019-10-11 MED ORDER — OXYCODONE HCL 5 MG/5ML PO SOLN
5.0000 mg | Freq: Four times a day (QID) | ORAL | Status: DC | PRN
  Administered 2019-10-12 (×2): 5 mg via ORAL
  Filled 2019-10-11 (×2): qty 5

## 2019-10-11 MED ORDER — GABAPENTIN 300 MG PO CAPS
300.0000 mg | ORAL_CAPSULE | ORAL | Status: AC
  Administered 2019-10-11: 300 mg via ORAL
  Filled 2019-10-11: qty 1

## 2019-10-11 MED ORDER — LIDOCAINE HCL 2 % IJ SOLN
INTRAMUSCULAR | Status: AC
  Filled 2019-10-11: qty 20

## 2019-10-11 MED ORDER — ENSURE MAX PROTEIN PO LIQD
2.0000 [oz_av] | ORAL | Status: DC
  Administered 2019-10-12 (×2): 2 [oz_av] via ORAL

## 2019-10-11 MED ORDER — SODIUM CHLORIDE (PF) 0.9 % IJ SOLN
INTRAMUSCULAR | Status: AC
  Filled 2019-10-11: qty 10

## 2019-10-11 MED ORDER — ROCURONIUM BROMIDE 10 MG/ML (PF) SYRINGE
PREFILLED_SYRINGE | INTRAVENOUS | Status: AC
  Filled 2019-10-11: qty 10

## 2019-10-11 MED ORDER — MIDAZOLAM HCL 2 MG/2ML IJ SOLN
INTRAMUSCULAR | Status: AC
  Filled 2019-10-11: qty 2

## 2019-10-11 MED ORDER — SUGAMMADEX SODIUM 500 MG/5ML IV SOLN
INTRAVENOUS | Status: AC
  Filled 2019-10-11: qty 5

## 2019-10-11 MED ORDER — LIDOCAINE HCL (CARDIAC) PF 100 MG/5ML IV SOSY
PREFILLED_SYRINGE | INTRAVENOUS | Status: DC | PRN
  Administered 2019-10-11: 60 mg via INTRAVENOUS

## 2019-10-11 MED ORDER — HYDROMORPHONE HCL 1 MG/ML IJ SOLN
INTRAMUSCULAR | Status: AC
  Filled 2019-10-11: qty 1

## 2019-10-11 MED ORDER — MORPHINE SULFATE (PF) 4 MG/ML IV SOLN
1.0000 mg | INTRAVENOUS | Status: DC | PRN
  Administered 2019-10-11: 3 mg via INTRAVENOUS
  Filled 2019-10-11: qty 1

## 2019-10-11 MED ORDER — 0.9 % SODIUM CHLORIDE (POUR BTL) OPTIME
TOPICAL | Status: DC | PRN
  Administered 2019-10-11: 1000 mL

## 2019-10-11 MED ORDER — KCL IN DEXTROSE-NACL 20-5-0.45 MEQ/L-%-% IV SOLN
INTRAVENOUS | Status: DC
  Filled 2019-10-11 (×2): qty 1000

## 2019-10-11 MED ORDER — ACETAMINOPHEN 160 MG/5ML PO SOLN
1000.0000 mg | Freq: Three times a day (TID) | ORAL | Status: DC
  Administered 2019-10-11 – 2019-10-12 (×2): 1000 mg via ORAL
  Filled 2019-10-11 (×2): qty 40.6

## 2019-10-11 MED ORDER — LIDOCAINE 2% (20 MG/ML) 5 ML SYRINGE
INTRAMUSCULAR | Status: AC
  Filled 2019-10-11: qty 5

## 2019-10-11 MED ORDER — EPHEDRINE 5 MG/ML INJ
INTRAVENOUS | Status: AC
  Filled 2019-10-11: qty 10

## 2019-10-11 MED ORDER — PROPOFOL 10 MG/ML IV BOLUS
INTRAVENOUS | Status: DC | PRN
  Administered 2019-10-11: 200 mg via INTRAVENOUS

## 2019-10-11 MED ORDER — SUCCINYLCHOLINE CHLORIDE 20 MG/ML IJ SOLN
INTRAMUSCULAR | Status: DC | PRN
  Administered 2019-10-11: 140 mg via INTRAVENOUS

## 2019-10-11 MED ORDER — APREPITANT 40 MG PO CAPS
40.0000 mg | ORAL_CAPSULE | ORAL | Status: AC
  Administered 2019-10-11: 40 mg via ORAL
  Filled 2019-10-11: qty 1

## 2019-10-11 MED ORDER — FENTANYL CITRATE (PF) 250 MCG/5ML IJ SOLN
INTRAMUSCULAR | Status: DC | PRN
  Administered 2019-10-11 (×2): 50 ug via INTRAVENOUS
  Administered 2019-10-11: 100 ug via INTRAVENOUS

## 2019-10-11 MED ORDER — HYDROMORPHONE HCL 1 MG/ML IJ SOLN
0.2500 mg | INTRAMUSCULAR | Status: DC | PRN
  Administered 2019-10-11 (×2): 0.5 mg via INTRAVENOUS

## 2019-10-11 MED ORDER — MIDAZOLAM HCL 5 MG/5ML IJ SOLN
INTRAMUSCULAR | Status: DC | PRN
  Administered 2019-10-11: 2 mg via INTRAVENOUS

## 2019-10-11 MED ORDER — EPHEDRINE SULFATE-NACL 50-0.9 MG/10ML-% IV SOSY
PREFILLED_SYRINGE | INTRAVENOUS | Status: DC | PRN
  Administered 2019-10-11: 5 mg via INTRAVENOUS

## 2019-10-11 MED ORDER — BUPIVACAINE LIPOSOME 1.3 % IJ SUSP
20.0000 mL | Freq: Once | INTRAMUSCULAR | Status: AC
  Administered 2019-10-11: 20 mL
  Filled 2019-10-11: qty 20

## 2019-10-11 MED ORDER — ACETAMINOPHEN 500 MG PO TABS
1000.0000 mg | ORAL_TABLET | Freq: Three times a day (TID) | ORAL | Status: DC
  Administered 2019-10-11: 1000 mg via ORAL
  Filled 2019-10-11: qty 2

## 2019-10-11 MED ORDER — HEPARIN SODIUM (PORCINE) 5000 UNIT/ML IJ SOLN
5000.0000 [IU] | Freq: Three times a day (TID) | INTRAMUSCULAR | Status: DC
  Administered 2019-10-11 – 2019-10-12 (×3): 5000 [IU] via SUBCUTANEOUS
  Filled 2019-10-11 (×3): qty 1

## 2019-10-11 MED ORDER — CHLORHEXIDINE GLUCONATE CLOTH 2 % EX PADS: 6.0000 | MEDICATED_PAD | Freq: Once | CUTANEOUS | Status: DC

## 2019-10-11 MED ORDER — LACTATED RINGERS IR SOLN
Status: DC | PRN
  Administered 2019-10-11: 1000 mL

## 2019-10-11 MED ORDER — ONDANSETRON HCL 4 MG/2ML IJ SOLN: 4.0000 mg | INTRAMUSCULAR | Status: DC | PRN

## 2019-10-11 MED ORDER — LIDOCAINE 20MG/ML (2%) 15 ML SYRINGE OPTIME
INTRAMUSCULAR | Status: DC | PRN
  Administered 2019-10-11: 1.5 mg/kg/h via INTRAVENOUS

## 2019-10-11 MED ORDER — PROMETHAZINE HCL 25 MG/ML IJ SOLN: 6.2500 mg | INTRAMUSCULAR | Status: DC | PRN

## 2019-10-11 MED ORDER — FENTANYL CITRATE (PF) 250 MCG/5ML IJ SOLN
INTRAMUSCULAR | Status: AC
  Filled 2019-10-11: qty 5

## 2019-10-11 MED ORDER — PHENYLEPHRINE 40 MCG/ML (10ML) SYRINGE FOR IV PUSH (FOR BLOOD PRESSURE SUPPORT)
PREFILLED_SYRINGE | INTRAVENOUS | Status: AC
  Filled 2019-10-11: qty 10

## 2019-10-11 MED ORDER — PANTOPRAZOLE SODIUM 40 MG IV SOLR
40.0000 mg | Freq: Every day | INTRAVENOUS | Status: DC
  Administered 2019-10-11: 40 mg via INTRAVENOUS
  Filled 2019-10-11: qty 40

## 2019-10-11 MED ORDER — SODIUM CHLORIDE (PF) 0.9 % IJ SOLN
INTRAMUSCULAR | Status: DC | PRN
  Administered 2019-10-11: 10 mL

## 2019-10-11 MED ORDER — DEXAMETHASONE SODIUM PHOSPHATE 10 MG/ML IJ SOLN
INTRAMUSCULAR | Status: DC | PRN
  Administered 2019-10-11: 10 mg via INTRAVENOUS

## 2019-10-11 MED ORDER — METOPROLOL TARTRATE 5 MG/5ML IV SOLN: 5.0000 mg | Freq: Four times a day (QID) | INTRAVENOUS | Status: DC | PRN

## 2019-10-11 MED ORDER — ONDANSETRON HCL 4 MG/2ML IJ SOLN
INTRAMUSCULAR | Status: DC | PRN
  Administered 2019-10-11: 4 mg via INTRAVENOUS

## 2019-10-11 MED ORDER — ONDANSETRON HCL 4 MG/2ML IJ SOLN
INTRAMUSCULAR | Status: AC
  Filled 2019-10-11: qty 2

## 2019-10-11 MED ORDER — PHENYLEPHRINE 40 MCG/ML (10ML) SYRINGE FOR IV PUSH (FOR BLOOD PRESSURE SUPPORT)
PREFILLED_SYRINGE | INTRAVENOUS | Status: DC | PRN
  Administered 2019-10-11 (×2): 80 ug via INTRAVENOUS

## 2019-10-11 MED ORDER — SODIUM CHLORIDE 0.9 % IV SOLN
2.0000 g | INTRAVENOUS | Status: AC
  Administered 2019-10-11: 2 g via INTRAVENOUS
  Filled 2019-10-11: qty 2

## 2019-10-11 MED ORDER — KETAMINE HCL 10 MG/ML IJ SOLN
INTRAMUSCULAR | Status: DC | PRN
  Administered 2019-10-11: 40 mg via INTRAVENOUS

## 2019-10-11 MED ORDER — KETAMINE HCL 10 MG/ML IJ SOLN
INTRAMUSCULAR | Status: AC
  Filled 2019-10-11: qty 1

## 2019-10-11 SURGICAL SUPPLY — 58 items
APPLIER CLIP 5 13 M/L LIGAMAX5 (MISCELLANEOUS)
APPLIER CLIP ROT 10 11.4 M/L (STAPLE)
APPLIER CLIP ROT 13.4 12 LRG (CLIP)
BLADE SURG 15 STRL LF DISP TIS (BLADE) ×1 IMPLANT
BLADE SURG 15 STRL SS (BLADE) ×1
CABLE HIGH FREQUENCY MONO STRZ (ELECTRODE) ×1 IMPLANT
CLIP APPLIE 5 13 M/L LIGAMAX5 (MISCELLANEOUS) IMPLANT
CLIP APPLIE ROT 10 11.4 M/L (STAPLE) IMPLANT
CLIP APPLIE ROT 13.4 12 LRG (CLIP) IMPLANT
COVER WAND RF STERILE (DRAPES) IMPLANT
DERMABOND ADVANCED (GAUZE/BANDAGES/DRESSINGS) ×1
DERMABOND ADVANCED .7 DNX12 (GAUZE/BANDAGES/DRESSINGS) IMPLANT
DEVICE SUT QUICK LOAD TK 5 (STAPLE) ×3 IMPLANT
DEVICE SUT TI-KNOT TK 5X26 (MISCELLANEOUS) ×1 IMPLANT
DEVICE SUTURE ENDOST 10MM (ENDOMECHANICALS) ×1 IMPLANT
DISSECTOR BLUNT TIP ENDO 5MM (MISCELLANEOUS) ×1 IMPLANT
ELECT REM PT RETURN 15FT ADLT (MISCELLANEOUS) ×2 IMPLANT
GAUZE SPONGE 4X4 12PLY STRL (GAUZE/BANDAGES/DRESSINGS) IMPLANT
GLOVE BIOGEL M 8.0 STRL (GLOVE) ×2 IMPLANT
GOWN STRL REUS W/TWL XL LVL3 (GOWN DISPOSABLE) ×8 IMPLANT
GRASPER SUT TROCAR 14GX15 (MISCELLANEOUS) ×2 IMPLANT
HANDLE STAPLE EGIA 4 XL (STAPLE) ×2 IMPLANT
HOVERMATT SINGLE USE (MISCELLANEOUS) ×2 IMPLANT
KIT BASIN OR (CUSTOM PROCEDURE TRAY) ×2 IMPLANT
KIT TURNOVER KIT A (KITS) IMPLANT
MARKER SKIN DUAL TIP RULER LAB (MISCELLANEOUS) ×2 IMPLANT
NDL SPNL 22GX3.5 QUINCKE BK (NEEDLE) ×1 IMPLANT
NEEDLE SPNL 22GX3.5 QUINCKE BK (NEEDLE) ×2 IMPLANT
PACK CARDIOVASCULAR III (CUSTOM PROCEDURE TRAY) ×2 IMPLANT
PENCIL SMOKE EVACUATOR (MISCELLANEOUS) IMPLANT
RELOAD STAPLE 45 PURP MED/THCK (STAPLE) IMPLANT
RELOAD TRI 45 ART MED THCK BLK (STAPLE) ×2 IMPLANT
RELOAD TRI 45 ART MED THCK PUR (STAPLE) ×2 IMPLANT
RELOAD TRI 60 ART MED THCK BLK (STAPLE) ×2 IMPLANT
RELOAD TRI 60 ART MED THCK PUR (STAPLE) ×4 IMPLANT
SCISSORS LAP 5X45 EPIX DISP (ENDOMECHANICALS) IMPLANT
SET IRRIG TUBING LAPAROSCOPIC (IRRIGATION / IRRIGATOR) ×2 IMPLANT
SET TUBE SMOKE EVAC HIGH FLOW (TUBING) ×2 IMPLANT
SHEARS HARMONIC ACE PLUS 45CM (MISCELLANEOUS) ×2 IMPLANT
SLEEVE ADV FIXATION 5X100MM (TROCAR) ×4 IMPLANT
SLEEVE GASTRECTOMY 36FR VISIGI (MISCELLANEOUS) ×2 IMPLANT
SOL ANTI FOG 6CC (MISCELLANEOUS) ×1 IMPLANT
SOLUTION ANTI FOG 6CC (MISCELLANEOUS) ×1
SPONGE LAP 18X18 RF (DISPOSABLE) ×2 IMPLANT
SUT MNCRL AB 4-0 PS2 18 (SUTURE) ×4 IMPLANT
SUT SURGIDAC NAB ES-9 0 48 120 (SUTURE) ×3 IMPLANT
SUT VICRYL 0 TIES 12 18 (SUTURE) ×2 IMPLANT
SYR 10ML ECCENTRIC (SYRINGE) ×2 IMPLANT
SYR 20ML LL LF (SYRINGE) ×2 IMPLANT
TOWEL OR 17X26 10 PK STRL BLUE (TOWEL DISPOSABLE) ×2 IMPLANT
TOWEL OR NON WOVEN STRL DISP B (DISPOSABLE) ×2 IMPLANT
TRAY FOLEY MTR SLVR 16FR STAT (SET/KITS/TRAYS/PACK) IMPLANT
TROCAR ADV FIXATION 5X100MM (TROCAR) ×2 IMPLANT
TROCAR BLADELESS 15MM (ENDOMECHANICALS) ×2 IMPLANT
TROCAR BLADELESS OPT 5 100 (ENDOMECHANICALS) ×2 IMPLANT
TUBE CALIBRATION LAPBAND (TUBING) ×1 IMPLANT
TUBING CONNECTING 10 (TUBING) ×2 IMPLANT
TUBING ENDO SMARTCAP (MISCELLANEOUS) ×2 IMPLANT

## 2019-10-11 NOTE — Transfer of Care (Signed)
Immediate Anesthesia Transfer of Care Note  Patient: Charles David  Procedure(s) Performed: LAPAROSCOPIC GASTRIC SLEEVE RESECTION WITH HIATAL HERNIA REPAIR, Upper Endo, ERAS Pathway (N/A Abdomen)  Patient Location: PACU  Anesthesia Type:General  Level of Consciousness: drowsy, patient cooperative and responds to stimulation  Airway & Oxygen Therapy: Patient Spontanous Breathing and Patient connected to face mask oxygen  Post-op Assessment: Report given to RN and Post -op Vital signs reviewed and stable  Post vital signs: Reviewed and stable  Last Vitals:  Vitals Value Taken Time  BP 149/91 10/11/19 1018  Temp    Pulse 108 10/11/19 1019  Resp 21 10/11/19 1019  SpO2 98 % 10/11/19 1019  Vitals shown include unvalidated device data.  Last Pain:  Vitals:   10/11/19 0601  TempSrc: Oral         Complications: No apparent anesthesia complications

## 2019-10-11 NOTE — Op Note (Signed)
11 October 2019  Surgeon: Wenda Low, MD, FACS  Asst:  Gaynelle Adu, MD, FACS  Anes:  General endotracheal  Procedure: Laparoscopic three suture repair of the hiatus, sleeve gastrectomy and upper endoscopy     Diagnosis: Morbid obesity  Complications: None noted  EBL:   minimal cc  Description of Procedure:  The patient was take to OR 2 and given general anesthesia.  The abdomen was prepped with Chloroprep and draped sterilely.  A timeout was performed.  Access to the abdomen was achieved with a 5 mm Optiview through the left upper quadrant.  Following insufflation, the state of the abdomen was found to be free of adhesions; a dimple was seen reflecting hiatal hernia.  The calibration tube was used to confirm this suspicion using 10 cc of air.  Two sutures were placed and he was retested and still had a bit of a slip and so a third posterior suture was placed and secured with a Ty Knot.  The ViSiGi 36Fr tube was inserted to deflate the stomach and was pulled back into the esophagus.    The pylorus was identified and we measured ~5 cm back and marked the antrum.  At that point we began dissection to take down the greater curvature of the stomach using the Harmonic scalpel.  This dissection was taken all the way up to the left crus. This was more challenging because of the size of his spleen and the attachments to the superior fundus.  When completed there were blue areas on the upper spleen.   Posterior attachments of the stomach were also taken down.    The ViSiGi tube was then passed into the antrum and suction applied so that it was snug along the lessor curvature.  The "crow's foot" or incisura was identified.  The sleeve gastrectomy was begun using the [employer] stapler beginning with a 4.5 cm black load with TRS buttress.  This was followed with a 6 cm black load with TRS and then sequential purple loads all with TRS.    When the sleeve was complete the tube was taken off  suction and insufflated briefly.  The tube was withdrawn.  Upper endoscopy was then performed by Dr. Andrey Campanile.     The specimen was extracted through the 15 trocar site.  0 vicryl with PRI to approximate 15 mm trocar site.   Local was provided by infiltrating with an Exparel Tap block at the first of the case and closed 4-0 Monocryl and Dermabond.    Matt B. Daphine Deutscher, MD, [facility], [place] [phone]

## 2019-10-11 NOTE — Discharge Instructions (Signed)
° ° ° °GASTRIC BYPASS/SLEEVE ° Home Care Instructions ° ° These instructions are to help you care for yourself when you go home. ° °Call: If you have any problems. °• Call [phone] and ask for the surgeon on call °• If you need immediate help, come to the ER at [facility].  °• Tell the ER staff that you are a new post-op gastric bypass or gastric sleeve patient °  °Signs and symptoms to report: • Severe vomiting or nausea °o If you cannot keep down clear liquids for longer than 1 day, call your surgeon  °• Abdominal pain that does not get better after taking your pain medication °• Fever over 100.4° F with chills °• Heart beating over 100 beats a minute °• Shortness of breath at rest °• Chest pain °•  Redness, swelling, drainage, or foul odor at incision (surgical) sites °•  If your incisions open or pull apart °• Swelling or pain in calf (lower leg) °• Diarrhea (Loose bowel movements that happen often), frequent watery, uncontrolled bowel movements °• Constipation, (no bowel movements for 3 days) if this happens: Pick one °o Milk of Magnesia, 2 tablespoons by mouth, 3 times a day for 2 days if needed °o Stop taking Milk of Magnesia once you have a bowel movement °o Call your doctor if constipation continues °Or °o Miralax  (instead of Milk of Magnesia) following the label instructions °o Stop taking Miralax once you have a bowel movement °o Call your doctor if constipation continues °• Anything you think is not normal °  °Normal side effects after surgery: • Unable to sleep at night or unable to focus °• Irritability or moody °• Being tearful (crying) or depressed °These are common complaints, possibly related to your anesthesia medications that put you to sleep, stress of surgery, and change in lifestyle.  This usually goes away a few weeks after surgery.  If these feelings continue, call your primary care doctor. °  °Wound Care: You may have surgical glue, steri-strips, or staples over your incisions after  surgery °• Surgical glue:  Looks like a clear film over your incisions and will wear off a little at a time °• Steri-strips: Strips of tape over your incisions. You may notice a yellowish color on the skin under the steri-strips. This is used to make the   steri-strips stick better. Do not pull the steri-strips off - let them fall off °• Staples: Staples may be removed before you leave the hospital °o If you go home with staples, call [facility], [phone] at for an appointment with your surgeon’s nurse to have staples removed 10 days after surgery. °• Showering: You may shower two (2) days after your surgery unless your surgeon tells you differently °o Wash gently around incisions with warm soapy water, rinse well, and gently pat dry  °o No tub baths until staples are removed, steri-strips fall off or glue is gone.  °  °Medications: • Medications should be liquid or crushed if larger than the size of a dime °• Extended release pills (medication that release a little bit at a time through the day) should NOT be crushed or cut. (examples include XL, ER, DR, SR) °• Depending on the size and number of medications you take, you may need to space (take a few throughout the day)/change the time you take your medications so that you do not over-fill your pouch (smaller stomach) °• Make sure you follow-up with your primary care doctor to   make medication changes needed during rapid weight loss and life-style changes °• If you have diabetes, follow up with the doctor that orders your diabetes medication(s) within one week after surgery and check your blood sugar regularly. °• Do not drive while taking prescription pain medication  °• It is ok to take Tylenol by the bottle instructions with your pain medicine or instead of your pain medicine as needed.  DO NOT TAKE NSAIDS (EXAMPLES OF NSAIDS:  IBUPROFREN/ NAPROXEN)  °Diet:                    First 2 Weeks ° You will see the dietician t about two (2) weeks  after your surgery. The dietician will increase the types of foods you can eat if you are handling liquids well: °• If you have severe vomiting or nausea and cannot keep down clear liquids lasting longer than 1 day, call your surgeon @ ([phone]) °Protein Shake °• Drink at least 2 ounces of shake 5-6 times per day °• Each serving of protein shakes (usually 8 - 12 ounces) should have: °o 15 grams of protein  °o And no more than 5 grams of carbohydrate  °• Goal for protein each day: °o Men = 80 grams per day °o Women = 60 grams per day °• Protein powder may be added to fluids such as non-fat milk or Lactaid milk or unsweetened Soy/Almond milk (limit to 35 grams added protein powder per serving) ° °Hydration °• Slowly increase the amount of water and other clear liquids as tolerated (See Acceptable Fluids) °• Slowly increase the amount of protein shake as tolerated  °•  Sip fluids slowly and throughout the day.  Do not use straws. °• May use sugar substitutes in small amounts (no more than 6 - 8 packets per day; i.e. Splenda) ° °Fluid Goal °• The first goal is to drink at least 8 ounces of protein shake/drink per day (or as directed by the nutritionist); some examples of protein shakes are Syntrax Nectar, Adkins Advantage, EAS Edge HP, and Unjury. See handout from pre-op Bariatric Education Class: °o Slowly increase the amount of protein shake you drink as tolerated °o You may find it easier to slowly sip shakes throughout the day °o It is important to get your proteins in first °• Your fluid goal is to drink 64 - 100 ounces of fluid daily °o It may take a few weeks to build up to this °• 32 oz (or more) should be clear liquids  °And  °• 32 oz (or more) should be full liquids (see below for examples) °• Liquids should not contain sugar, caffeine, or carbonation ° °Clear Liquids: °• Water or Sugar-free flavored water (i.e. Fruit H2O, Propel) °• Decaffeinated coffee or tea (sugar-free) °• Crystal Lite, Wyler’s Lite,  Minute Maid Lite °• Sugar-free Jell-O °• Bouillon or broth °• Sugar-free Popsicle:   *Less than 20 calories each; Limit 1 per day ° °Full Liquids: °Protein Shakes/Drinks + 2 choices per day of other full liquids °• Full liquids must be: °o No More Than 15 grams of Carbs per serving  °o No More Than 3 grams of Fat per serving °• Strained low-fat cream soup (except Cream of Potato or Tomato) °• Non-Fat milk °• Fat-free Lactaid Milk °• Unsweetened Soy Or Unsweetened Almond Milk °• Low Sugar yogurt (Dannon Lite & Fit, Greek yogurt; Oikos Triple Zero; Chobani Simply 100; Yoplait 100 calorie Greek - No Fruit on the Bottom) ° °  °Vitamins   and Minerals • Start 1 day after surgery unless otherwise directed by your surgeon °• 2 Chewable Bariatric Specific Multivitamin / Multimineral Supplement with iron (Example: Bariatric Advantage Multi EA) °• Chewable Calcium with Vitamin D-3 °(Example: 3 Chewable Calcium Plus 600 with Vitamin D-3) °o Take 500 mg three (3) times a day for a total of 1500 mg each day °o Do not take all 3 doses of calcium at one time as it may cause constipation, and you can only absorb 500 mg  at a time  °o Do not mix multivitamins containing iron with calcium supplements; take 2 hours apart °• Menstruating women and those with a history of anemia (a blood disease that causes weakness) may need extra iron °o Talk with your doctor to see if you need more iron °• Do not stop taking or change any vitamins or minerals until you talk to your dietitian or surgeon °• Your Dietitian and/or surgeon must approve all vitamin and mineral supplements °  °Activity and Exercise: Limit your physical activity as instructed by your doctor.  It is important to continue walking at home.  During this time, use these guidelines: °• Do not lift anything greater than ten (10) pounds for at least two (2) weeks °• Do not go back to work or drive until your surgeon says you can °• You may have sex when you feel comfortable  °o It is  VERY important for male patients to use a reliable birth control method; fertility often increases after surgery  °o All hormonal birth control will be ineffective for 30 days after surgery due to medications given during surgery a barrier method must be used. °o Do not get pregnant for at least 18 months °• Start exercising as soon as your doctor tells you that you can °o Make sure your doctor approves any physical activity °• Start with a simple walking program °• Walk 5-15 minutes each day, 7 days per week.  °• Slowly increase until you are walking 30-45 minutes per day °Consider joining our BELT program. [phone] or email [email or web address] °  °Special Instructions Things to remember: °• Use your CPAP when sleeping if this applies to you ° °• [facility] has two free Bariatric Surgery Support Groups that meet monthly °o The 3rd Thursday of each month, 6 pm, [facility]  °o The 2nd Friday of each month, 11:45 am in the private dining room in the basement of  °• It is very important to keep all follow up appointments with your surgeon, dietitian, primary care physician, and behavioral health practitioner °• Routine follow up schedule with your surgeon include appointments at 2-3 weeks, 6-8 weeks, 6 months, and 1 year at a minimum.  Your surgeon may request to see you more often.   °o After the first year, please follow up with your bariatric surgeon and dietitian at least once a year in order to maintain best weight loss results °[facility]: [phone] °[facility]: [phone] °Bariatric Nurse Coordinator: [phone] °  °   Reviewed and Endorsed  °by [facility] Patient Education Committee, June, 2016 °Edits Approved: Aug, 2018 ° ° ° °

## 2019-10-11 NOTE — Op Note (Signed)
Charles David [id number] 08/07/1975 10/11/2019  Preoperative diagnosis: severe obesity  Postoperative diagnosis: Same   Procedure: upper endoscopy   Surgeon: Mary Sella. Jenya Putz M.D., FACS   Anesthesia: Gen.   Indications for procedure: 44 y.o. year old male undergoing Laparoscopic Gastric Sleeve Resection and an EGD was requested to evaluate the new gastric sleeve.   Description of procedure: After we have completed the sleeve resection, I scrubbed out and obtained the Olympus endoscope. I gently placed endoscope in the patient's oropharynx and gently glided it down the esophagus without any difficulty under direct visualization. Once I was in the gastric sleeve, I insufflated the stomach with air. I was able to cannulate and advanced the scope through the gastric sleeve. I was able to cannulate the duodenum with ease. Dr. Daphine Deutscher had placed saline in the upper abdomen. Upon further insufflation of the gastric sleeve there was no evidence of bubbles. GE junction located at 40 cm - slightly irregular z line but no breaks in mucosa .  Upon further inspection of the gastric sleeve, the mucosa appeared normal. There is no evidence of any mucosal abnormality. The sleeve was widely patent at the angularis. There was no evidence of bleeding. The gastric sleeve was decompressed. The scope was withdrawn. The patient tolerated this portion of the procedure well. Please see Dr Ermalene Searing operative note for details regarding the laparoscopic gastric sleeve resection.   Mary Sella. Andrey Campanile, MD, FACS  General, Bariatric, & Minimally Invasive Surgery  [facility], [place]

## 2019-10-11 NOTE — Progress Notes (Signed)
Discussed post op day goals with patient including ambulation, IS, diet progression, pain, and nausea control.  BSTOP education provided including BSTOP information guide, "Guide for Pain Management after your Bariatric Procedure".  Questions answered. 

## 2019-10-11 NOTE — Progress Notes (Signed)
Patient ambulated from stretcher in hall to chair in room with 2 assist. Lina Sar, RN

## 2019-10-11 NOTE — Progress Notes (Signed)
NUTRITION NOTE  Consult received for Bariatric diet education per DROP protocol. Bariatric Nurse Educator to provide all post-op/pre-discharge information, including diet-related information.   If additional nutrition-related needs arise, please re-consult RD.    Trenton Gammon, MS, RD, LDN, CNSC Inpatient Clinical Dietitian RD pager # available in [facility]  After hours/weekend pager # available in [facility]

## 2019-10-11 NOTE — Progress Notes (Signed)
PHARMACY CONSULT FOR:  Risk Assessment for Post-Discharge VTE Following Bariatric Surgery  Post-Discharge VTE Risk Assessment: This patient's probability of 30-day post-discharge VTE is increased due to the factors marked:  x Male    Age >/=60 years    BMI >/=50 kg/m2    CHF    Dyspnea at Rest    Paraplegia   x Non-gastric-band surgery    Operation Time >/=3 hr    Return to OR     Length of Stay >/= 3 David      Hx of VTE   Hypercoagulable condition   Significant venous stasis   Predicted probability of 30-day post-discharge VTE: 0.31%  Other patient-specific factors to consider:   Recommendation for Discharge: No pharmacologic prophylaxis post-discharge  Charles David is a 44 y.o. male who underwent  laparoscopic sleeve gastrectomy on 10/11/19   Case start: 0811 Case end: 1006   No Known Allergies  Patient Measurements: Weight: (!) 351 lb 6.4 oz (159.4 kg) Body mass index is 46.36 kg/m.  Recent Labs    10/11/19 0615  WBC 8.3  HGB 14.1  HCT 42.3  PLT 204  CREATININE 0.77  ALBUMIN 4.2  PROT 7.1  AST 44*  ALT 48*  ALKPHOS 67  BILITOT 1.1   Estimated Creatinine Clearance: 184.2 mL/min (by C-G formula based on SCr of 0.77 mg/dL).    Past Medical History:  Diagnosis Date  . Fatty liver   . GERD (gastroesophageal reflux disease)   . Hiatal hernia   . OSA (obstructive sleep apnea)      Medications Prior to Admission  Medication Sig Dispense Refill Last Dose  . pantoprazole (PROTONIX) 40 MG tablet Take 1 tablet (40 mg total) by mouth daily. 90 tablet 3 10/11/2019 at 0400       Charles David 10/11/2019,12:33 PM

## 2019-10-11 NOTE — Anesthesia Postprocedure Evaluation (Signed)
Anesthesia Post Note  Patient: Charles David  Procedure(s) Performed: LAPAROSCOPIC GASTRIC SLEEVE RESECTION WITH HIATAL HERNIA REPAIR, Upper Endo, ERAS Pathway (N/A Abdomen)     Patient location during evaluation: PACU Anesthesia Type: General Level of consciousness: awake and alert Pain management: pain level controlled Vital Signs Assessment: post-procedure vital signs reviewed and stable Respiratory status: spontaneous breathing, nonlabored ventilation, respiratory function stable and patient connected to nasal cannula oxygen Cardiovascular status: blood pressure returned to baseline and stable Postop Assessment: no apparent nausea or vomiting Anesthetic complications: no    Last Vitals:  Vitals:   10/11/19 1115 10/11/19 1141  BP: (!) 146/88 133/76  Pulse: 100 99  Resp: 17 20  Temp: 36.6 C (!) 36.4 C  SpO2: 93% 97%    Last Pain:  Vitals:   10/11/19 1115  TempSrc:   PainSc: Asleep                 Zhyon Antenucci S

## 2019-10-11 NOTE — Anesthesia Procedure Notes (Signed)
Procedure Name: Intubation Date/Time: 10/11/2019 7:38 AM Performed by: Thornell Mule, CRNA Pre-anesthesia Checklist: Patient identified, Emergency Drugs available, Suction available and Patient being monitored Patient Re-evaluated:Patient Re-evaluated prior to induction Oxygen Delivery Method: Circle system utilized Preoxygenation: Pre-oxygenation with 100% oxygen Induction Type: IV induction Ventilation: Mask ventilation without difficulty Laryngoscope Size: Glidescope and 4 Grade View: Grade I Tube type: Oral Tube size: 7.5 mm Number of attempts: 1 Airway Equipment and Method: Stylet and Video-laryngoscopy Placement Confirmation: ETT inserted through vocal cords under direct vision,  positive ETCO2 and breath sounds checked- equal and bilateral Secured at: 23 cm Tube secured with: Tape Dental Injury: Teeth and Oropharynx as per pre-operative assessment  Difficulty Due To: Difficulty was anticipated, Difficult Airway- due to large tongue and Difficult Airway- due to limited oral opening

## 2019-10-11 NOTE — Interval H&P Note (Signed)
History and Physical Interval Note:  10/11/2019 7:12 AM  Charles David  has presented today for surgery, with the diagnosis of Morbid Obesity, OSA, GERD.  The various methods of treatment have been discussed with the patient and family. After consideration of risks, benefits and other options for treatment, the patient has consented to  Procedure(s): LAPAROSCOPIC GASTRIC SLEEVE RESECTION, Upper Endo, ERAS Pathway (N/A) as a surgical intervention.  The patient's history has been reviewed, patient examined, no change in status, stable for surgery.  I have reviewed the patient's chart and labs.  Questions were answered to the patient's satisfaction.     Valarie Merino

## 2019-10-12 ENCOUNTER — Encounter: Payer: Self-pay | Admitting: Family Medicine

## 2019-10-12 LAB — CBC WITH DIFFERENTIAL/PLATELET
Abs Immature Granulocytes: 0.07 10*3/uL (ref 0.00–0.07)
Basophils Absolute: 0 10*3/uL (ref 0.0–0.1)
Basophils Relative: 0 %
Eosinophils Absolute: 0 10*3/uL (ref 0.0–0.5)
Eosinophils Relative: 0 %
HCT: 38.8 % — ABNORMAL LOW (ref 39.0–52.0)
Hemoglobin: 13.3 g/dL (ref 13.0–17.0)
Immature Granulocytes: 1 %
Lymphocytes Relative: 11 %
Lymphs Abs: 1.5 10*3/uL (ref 0.7–4.0)
MCH: 29.6 pg (ref 26.0–34.0)
MCHC: 34.3 g/dL (ref 30.0–36.0)
MCV: 86.2 fL (ref 80.0–100.0)
Monocytes Absolute: 0.9 10*3/uL (ref 0.1–1.0)
Monocytes Relative: 7 %
Neutro Abs: 10.6 10*3/uL — ABNORMAL HIGH (ref 1.7–7.7)
Neutrophils Relative %: 81 %
Platelets: 240 10*3/uL (ref 150–400)
RBC: 4.5 MIL/uL (ref 4.22–5.81)
RDW: 12.6 % (ref 11.5–15.5)
WBC: 13.1 10*3/uL — ABNORMAL HIGH (ref 4.0–10.5)
nRBC: 0 % (ref 0.0–0.2)

## 2019-10-12 LAB — SURGICAL PATHOLOGY

## 2019-10-12 MED ORDER — OXYCODONE HCL 5 MG PO TABS: 5.0000 mg | ORAL_TABLET | Freq: Four times a day (QID) | ORAL | 0 refills | Status: DC | PRN

## 2019-10-12 MED ORDER — ONDANSETRON 4 MG PO TBDP: 4.0000 mg | ORAL_TABLET | Freq: Four times a day (QID) | ORAL | 0 refills | Status: DC | PRN

## 2019-10-12 MED FILL — oxyCODONE HCL 5 MG TABS: 5 | 3 days supply | Qty: 10 | Fill #0

## 2019-10-12 MED FILL — ONDANSETRON ODT 4 MG TABLET: 4 | 5 days supply | Qty: 20 | Fill #0

## 2019-10-12 NOTE — Progress Notes (Signed)
Patient alert and oriented, pain is controlled. Patient is tolerating fluids, advanced to protein shake today, patient is tolerating well.  Reviewed Gastric sleeve discharge instructions with patient and patient is able to articulate understanding.  Provided information on BELT program, Support Group and [facility] outpatient pharmacy. All questions answered, will continue to monitor.  Total fluid intake 780 Per dehydration protocol call back one week

## 2019-10-12 NOTE — Progress Notes (Signed)
Patient ID: Charles David, male   DOB: [birth year], 44 y.o.   MRN: [id number] [facility] Progress Note:   1 Day Post-Op  Subjective: Mental status is clear.  Sitting up without complaints.  Photos of surgery given Objective: Vital signs in last 24 hours: Temp:  [97.5 F (36.4 C)-98.9 F (37.2 C)] 97.7 F (36.5 C) (03/24 0603) Pulse Rate:  [97-110] 101 (03/24 0603) Resp:  [15-20] 17 (03/24 0603) BP: (122-155)/(67-91) 131/80 (03/24 0603) SpO2:  [93 %-100 %] 93 % (03/24 0603)  Intake/Output from previous day: 03/23 0701 - 03/24 0700 In: 3571.6 [P.O.:600; I.V.:2871.6; IV Piggyback:100] Out: 478 [Urine:453; Blood:25] Intake/Output this shift: Total I/O In: 1160 [P.O.:360; I.V.:800] Out: 3 [Urine:3]  Physical Exam: Work of breathing is not labored.  Has started liquids.    Lab Results:  Results for orders placed or performed during the hospital encounter of 10/11/19 (from the past 48 hour(s))  CBC WITH DIFFERENTIAL     Status: None   Collection Time: 10/11/19  6:15 AM  Result Value Ref Range   WBC 8.3 4.0 - 10.5 K/uL   RBC 4.92 4.22 - 5.81 MIL/uL   Hemoglobin 14.1 13.0 - 17.0 g/dL   HCT 92.4 26.8 - 34.1 %   MCV 86.0 80.0 - 100.0 fL   MCH 28.7 26.0 - 34.0 pg   MCHC 33.3 30.0 - 36.0 g/dL   RDW 96.2 22.9 - 79.8 %   Platelets 204 150 - 400 K/uL   nRBC 0.0 0.0 - 0.2 %   Neutrophils Relative % 49 %   Neutro Abs 4.1 1.7 - 7.7 K/uL   Lymphocytes Relative 39 %   Lymphs Abs 3.2 0.7 - 4.0 K/uL   Monocytes Relative 9 %   Monocytes Absolute 0.7 0.1 - 1.0 K/uL   Eosinophils Relative 2 %   Eosinophils Absolute 0.2 0.0 - 0.5 K/uL   Basophils Relative 1 %   Basophils Absolute 0.1 0.0 - 0.1 K/uL   Immature Granulocytes 0 %   Abs Immature Granulocytes 0.01 0.00 - 0.07 K/uL    Comment: Performed at [facility], [address] [place], [place] [address]  Type and screen [facility]     Status: None   Collection Time: 10/11/19   6:15 AM  Result Value Ref Range   ABO/RH(D) B POS    Antibody Screen NEG    Sample Expiration      10/14/2019,2359 Performed at [facility], [address], [place], [place] [address]   Comprehensive metabolic panel     Status: Abnormal   Collection Time: 10/11/19  6:15 AM  Result Value Ref Range   Sodium 137 135 - 145 mmol/L   Potassium 4.6 3.5 - 5.1 mmol/L    Comment: MODERATE HEMOLYSIS   Chloride 104 98 - 111 mmol/L   CO2 22 22 - 32 mmol/L   Glucose, Bld 119 (H) 70 - 99 mg/dL    Comment: Glucose reference range applies only to samples taken after fasting for at least 8 hours.   BUN 14 6 - 20 mg/dL   Creatinine, Ser 8.14 0.61 - 1.24 mg/dL   Calcium 8.7 (L) 8.9 - 10.3 mg/dL   Total Protein 7.1 6.5 - 8.1 g/dL   Albumin 4.2 3.5 - 5.0 g/dL   AST 44 (H) 15 - 41 U/L   ALT 48 (H) 0 - 44 U/L   Alkaline Phosphatase 67 38 - 126 U/L   Total Bilirubin 1.1  0.3 - 1.2 mg/dL   GFR calc non Af Amer >60 >60 mL/min   GFR calc Af Amer >60 >60 mL/min   Anion gap 11 5 - 15    Comment: Performed at [facility], [place] [address], [place], [place] [address]  ABO/Rh     Status: None   Collection Time: 10/11/19  6:15 AM  Result Value Ref Range   ABO/RH(D)      B POS Performed at [facility], [place] [address], [place], [place] [address]   Hemoglobin and hematocrit, blood     Status: None   Collection Time: 10/11/19  2:24 PM  Result Value Ref Range   Hemoglobin 14.5 13.0 - 17.0 g/dL   HCT 42.5 39.0 - 52.0 %    Comment: Performed at [facility], [place] [address], [place], [place] [address]  CBC WITH DIFFERENTIAL     Status: Abnormal   Collection Time: 10/12/19  4:36 AM  Result Value Ref Range   WBC 13.1 (H) 4.0 - 10.5 K/uL   RBC 4.50 4.22 - 5.81 MIL/uL   Hemoglobin 13.3 13.0 - 17.0 g/dL   HCT 38.8 (L) 39.0 - 52.0 %   MCV 86.2 80.0 - 100.0 fL   MCH 29.6 26.0 - 34.0 pg   MCHC 34.3 30.0 - 36.0 g/dL   RDW 12.6 11.5 - 15.5  %   Platelets 240 150 - 400 K/uL   nRBC 0.0 0.0 - 0.2 %   Neutrophils Relative % 81 %   Neutro Abs 10.6 (H) 1.7 - 7.7 K/uL   Lymphocytes Relative 11 %   Lymphs Abs 1.5 0.7 - 4.0 K/uL   Monocytes Relative 7 %   Monocytes Absolute 0.9 0.1 - 1.0 K/uL   Eosinophils Relative 0 %   Eosinophils Absolute 0.0 0.0 - 0.5 K/uL   Basophils Relative 0 %   Basophils Absolute 0.0 0.0 - 0.1 K/uL   Immature Granulocytes 1 %   Abs Immature Granulocytes 0.07 0.00 - 0.07 K/uL    Comment: Performed at [facility], [place] [address] [place],  [address]    Radiology/Results: No results found.  Anti-infectives: Anti-infectives (From admission, onward)   Start     Dose/Rate Route Frequency Ordered Stop   10/11/19 0600  cefoTEtan (CEFOTAN) 2 g in sodium chloride 0.9 % 100 mL IVPB     2 g 200 mL/hr over 30 Minutes Intravenous On call to O.R. 10/11/19 0534 10/11/19 0809      Assessment/Plan: Problem List: Patient Active Problem List   Diagnosis Date Noted  . 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 DOE (dyspnea on exertion) 12/02/2015  . Nausea without vomiting 05/24/2015  . GERD (gastroesophageal reflux disease) 05/24/2015  . Bone cyst, solitary 05/24/2015  . Hyperglycemia 05/24/2015  . Family history of colon cancer 05/24/2015  . Fatty liver   . OSA (obstructive sleep apnea) 01/01/2015    Elevated WBC may be related to top of spleen that had a blue area.  Minimal pain.  Advancing diet and hopeful discharge later today.  Will give Rx for 5 mg Oxy at home.  Good relief from Exparel TAP block.   1 Day Post-Op    LOS: 1 day   Matt B. Hassell Done, MD, [facility] [phone] beeper [phone]  10/12/2019 6:53 AM

## 2019-10-12 NOTE — Progress Notes (Signed)
Patient alert and oriented, Post op day 1.  Provided support and encouragement.  Encouraged pulmonary toilet, ambulation and small sips of liquids. Completed 12 ounces of bari clear fluid and a complete protein shake. All questions answered.  Will continue to monitor.

## 2019-10-12 NOTE — Discharge Summary (Signed)
Physician Discharge Summary  Patient ID: Charles David MRN: [id number] DOB/AGE: [birth year] 46 y.o.  PCP: Shelva Majestic, MD  Admit date: 10/11/2019 Discharge date: 10/12/2019  Admission Diagnoses:  Obesity   Discharge Diagnoses:  Same with hiatal hernia  Principal Problem:   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   Surgery:  Lap hiatal hernia repair and sleeve gastrectomy  Discharged Condition: good  Hospital Course:   Had sleeve gastrectomy with 3 suture posterior hiatus hernia closure.  He did well and was ready for discharge on PD 1.    Consults: none  Significant Diagnostic Studies: none    Discharge Exam: Blood pressure 133/84, pulse 87, temperature 98.6 F (37 C), temperature source Oral, resp. rate 18, weight (!) 159.4 kg, SpO2 99 %. Incisions OK  Disposition: Discharge disposition: 01-Home or Self Care       Discharge Instructions    Ambulate hourly while awake   Complete by: As directed    Call MD for:  difficulty breathing, headache or visual disturbances   Complete by: As directed    Call MD for:  persistant dizziness or light-headedness   Complete by: As directed    Call MD for:  persistant nausea and vomiting   Complete by: As directed    Call MD for:  redness, tenderness, or signs of infection (pain, swelling, redness, odor or green/yellow discharge around incision site)   Complete by: As directed    Call MD for:  severe uncontrolled pain   Complete by: As directed    Call MD for:  temperature >101 F   Complete by: As directed    Diet bariatric full liquid   Complete by: As directed    Incentive spirometry   Complete by: As directed    Perform hourly while awake     Allergies as of 10/12/2019   No Known Allergies     Medication List    TAKE these medications   ondansetron 4 MG disintegrating tablet Commonly known as: ZOFRAN-ODT Take 1 tablet (4 mg total) by mouth every 6 (six) hours as  needed for nausea or vomiting.   oxyCODONE 5 MG immediate release tablet Commonly known as: Oxy IR/ROXICODONE Take 1 tablet (5 mg total) by mouth every 6 (six) hours as needed for severe pain.   pantoprazole 40 MG tablet Commonly known as: Protonix Take 1 tablet (40 mg total) by mouth daily.      Follow-up Information    Surgery, [place]. Go on 11/02/2019.   Specialty: General Surgery Why: Your appointment is with Dr Wenda Low at 10 am.  Please arrive 15 minutes prior to appointment time. Contact information: [address] [place] [address] [phone]        Hedda Slade, PA-C. Go on 12/01/2019.   Specialty: General Surgery Why: at 915 am.  Please arrive 15 minutes prior to appointment.  Thank you Contact information: [address] [place] [address] [phone]           Signed: Valarie Merino 10/12/2019, 12:20 PM

## 2019-10-13 ENCOUNTER — Other Ambulatory Visit: Payer: Self-pay | Admitting: *Deleted

## 2019-10-13 ENCOUNTER — Encounter: Payer: Self-pay | Admitting: *Deleted

## 2019-10-13 MED FILL — TOBRAMYCIN-DEXAMETH OPTH SU: 0.3-0.1 | 7 days supply | Qty: 5 | Fill #0

## 2019-10-13 NOTE — Patient Outreach (Signed)
[facility]) Care Management  10/13/2019  Charles David [birth year] [id number]   Transition of care call/case closure   Referral received:10/11/19 Initial outreach:10/13/19 Insurance: [employer]    Subjective: Initial successful telephone call to patient's preferred number in order to complete transition of care assessment; 2 HIPAA identifiers verified. Explained purpose of call and completed transition of care assessment.  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 He denies post-operative problems, says surgical incisions are unremarkable glue at incision sites.  states surgical pain well managed with prescribed medications. He reports tolerating bariatric liquid diet working toward daily intake goal, encouraged to notify surgeon if concerns regarding intake amount,  he reports some cramping in stomach when drinking as been told that is expected. He report having bowel movement after taking milk of magnesia. He reports tolerating mobility in the home and use so incentive spirometry hourly during the day .   Spouse is  assisting with his  recovery.  [name] Kitchen  He reports that he does have the hospital indemnity plan requested contact number be sent to his work email, sent to patient work email as reviewed.  He uses a [facility] .      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  From 3/23-3/24/21 for Laparoscopic sleeve gastrectomy , hiatal hernia repair.  Comorbidities include: Obesity, Obstructive sleep apnea,GERD   He was discharged to home on 09/15/19 without the need for home health services or DME.   Assessment:  Patient voices good understanding of all discharge instructions.  See transition of care flowsheet for assessment details.   Plan:  Reviewed hospital discharge diagnosis of Laparoscopic sleeve gastrectomy hiatal hernia  and discharge treatment plan using hospital discharge  instructions, assessing medication adherence, reviewing problems requiring provider notification, and discussing the importance of follow up with surgeon, primary care provider as directed. Reviewed [employer] healthy lifestyle program information to receive discounted premium for  2022  Step 1: Get annual physical between July 21, 2018 and January 19, 2020; Step 2: Complete your health assessment between July 22, 2019 and March 21, 2020 at [email or web address] Step 3:Identify your current health status and complete the corresponding action step between January 1, and March 21, 2020.     No ongoing care management needs identified so will close case to [facility] Care Management services and route successful outreach letter with [facility] Care Management pamphlet and 24 Hour Nurse Line Magnet to [employer] Care Management clinical pool to be mailed to patient's home address.  Thanked patient for their services to [facility].   [name], RN, BSN  [facility] Care Management,Care Management Coordinator  [phone]- Mobile [phone]- Toll Free Main Office

## 2019-10-17 ENCOUNTER — Telehealth (HOSPITAL_COMMUNITY): Payer: Self-pay

## 2019-10-17 NOTE — Telephone Encounter (Signed)
Patient called to discuss post bariatric surgery follow up questions.  See below:   1.  Tell me about your pain and pain management?denies  2.  Let's talk about fluid intake.  How much total fluid are you taking in?50-64 ounces  3.  How much protein have you taken in the last 2 days?60-80 grams of protein  4.  Have you had nausea?  Tell me about when have experienced nausea and what you did to help?denies  5.  Has the frequency or color changed with your urine?no problem light color urine  6.  Tell me what your incisions look like?no  7.  Have you been passing gas? BM?bm since discharge with MOM  8.  If a problem or question were to arise who would you call?  Do you know contact numbers for [facility], [facility], and [facility]?aware of all services  9.  How has the walking going?walking regularly  10.  How are your vitamins and calcium going?  How are you taking them?Discussed mvi and calcium, was taking only one calcium discussed increasing to 3 per day  Started having some phlegm and congestion, throat pain.  Wonders if trauma from intubation.

## 2019-10-25 ENCOUNTER — Encounter: Payer: 59 | Attending: Surgery | Admitting: Skilled Nursing Facility1

## 2019-10-25 ENCOUNTER — Other Ambulatory Visit: Payer: Self-pay

## 2019-10-25 DIAGNOSIS — E669 Obesity, unspecified: Secondary | ICD-10-CM | POA: Diagnosis not present

## 2019-10-26 NOTE — Progress Notes (Signed)
2 Week Post-Operative Nutrition Class   Patient was seen on 09/14/18 for Post-Operative Nutrition education at the [facility].    Surgery date: 10/11/2019 Surgery type: sleeve Start weight at [facility]: 340.5 Weight today: 318.2   Body Composition Scale 10/25/2019  Total Body Fat % 36  Visceral Fat 28  Fat-Free Mass % 63.9   Total Body Water % 44.9   Muscle-Mass lbs 60  Body Fat Displacement          Torso  lbs 71.2         Left Leg  lbs 14.2         Right Leg  lbs 14.2         Left Arm  lbs 7.1         Right Arm   lbs 7.1     The following the learning objectives were met by the patient during this course:  Identifies Phase 3 (Soft, High Proteins) Dietary Goals and will begin from 2 weeks post-operatively to 2 months post-operatively  Identifies appropriate sources of fluids and proteins   States protein recommendations and appropriate sources post-operatively  Identifies the need for appropriate texture modifications, mastication, and bite sizes when consuming solids  Identifies appropriate multivitamin and calcium sources post-operatively  Describes the need for physical activity post-operatively and will follow MD recommendations  States when to call healthcare provider regarding medication questions or post-operative complications   Handouts given during class include:  Phase 3A: Soft, High Protein Diet Handout   Follow-Up Plan: Patient will follow-up at [facility] in 6 weeks for 2 month post-op nutrition visit for diet advancement per MD.

## 2019-10-31 ENCOUNTER — Telehealth: Payer: Self-pay | Admitting: Skilled Nursing Facility1

## 2019-10-31 NOTE — Telephone Encounter (Signed)
RD called pt to verify fluid intake once starting soft, solid proteins 2 week post-bariatric surgery.   Daily Fluid intake: 60+ Daily Protein intake: 80  Concerns/issues:   None stated

## 2019-11-04 MED FILL — PANTOPRAZOLE SOD DR 40 MG T: 40 | 90 days supply | Qty: 90 | Fill #3

## 2019-12-05 ENCOUNTER — Other Ambulatory Visit: Payer: Self-pay

## 2019-12-05 ENCOUNTER — Encounter: Payer: 59 | Attending: Surgery | Admitting: Skilled Nursing Facility1

## 2019-12-05 DIAGNOSIS — E669 Obesity, unspecified: Secondary | ICD-10-CM | POA: Insufficient documentation

## 2019-12-05 NOTE — Progress Notes (Signed)
Bariatric Nutrition Follow-Up Visit Medical Nutrition Therapy   NUTRITION ASSESSMENT    Anthropometrics  Surgery date: 10/11/2019 Surgery type: sleeve Start weight at [facility]: 340.5 Weight today: 297.1   Body Composition Scale 10/25/2019 12/05/2019  Total Body Fat % 36 33.9  Visceral Fat 28 25  Fat-Free Mass % 63.9 66   Total Body Water % 44.9 47   Muscle-Mass lbs 60 56  Body Fat Displacement           Torso  lbs 71.2 62.5         Left Leg  lbs 14.2 12.5         Right Leg  lbs 14.2 12.5         Left Arm  lbs 7.1 6.2         Right Arm   lbs 7.1 6.2    Clinical  Medical hx:  Medications:  Labs:    Lifestyle & Dietary Hx  Pt is doing well.   Estimated daily fluid intake: 60+ oz Estimated daily protein intake: 80+ g Supplements: bari advantage + calcium  Current average weekly physical activity: ADL's; trying to figure something   24-Hr Dietary Recall: w=work, o-off; eating every 4 hours  First Meal: w: coffee and water o: 10am egg and chicken sausage Snack:  Second Meal 10:30-11: w: [place] slices or chicken slices with cheese  Snack: protein shake o: other half of breakfast  Third Meal: [place] slices or chicken slices with cheese  Or [place] salad o: protein shake Snack: 8pm egg and [place] sausage  Beverages: 1 cup of coffee, 3 8 ounce bottles at work, herbal tea  Post-Op Goals/ Signs/ Symptoms Using straws: no Drinking while eating: no Chewing/swallowing difficulties: no Changes in vision: no Changes to mood/headaches: no Hair loss/changes to skin/nails: no Difficulty focusing/concentrating: no Sweating: no Dizziness/lightheadedness: n Palpitations: no  Carbonated/caffeinated beverages: no N/V/D/C/Gas: no Abdominal pain: no Dumping syndrome: no    NUTRITION DIAGNOSIS  Overweight/obesity (Hassell-3.3) related to past poor dietary habits and physical inactivity as evidenced by completed bariatric surgery and following dietary guidelines for continued weight loss  and healthy nutrition status.     NUTRITION INTERVENTION Nutrition counseling (C-1) and education (E-2) to facilitate bariatric surgery goals, including: . Diet advancement to the next phase (phase 4) now including non starchy vegetables . The importance of consuming adequate calories as well as certain nutrients daily due to the body's need for essential vitamins, minerals, and fats . The importance of daily physical activity and to reach a goal of at least 150 minutes of moderate to vigorous physical activity weekly (or as directed by their physician) due to benefits such as increased musculature and improved lab values . The importance of intuitive eating specifically learning hunger-satiety cues and understanding the importance of learning a new body  Handouts Provided Include   Phase 4  Learning Style & Readiness for Change Teaching method utilized: Visual & Auditory  Demonstrated degree of understanding via: Teach Back  Barriers to learning/adherence to lifestyle change: none identified   RD's Notes for Next Visit . Assess adherence to pt chosen goals    MONITORING & EVALUATION Dietary intake, weekly physical activity, body weight  Next Steps Patient is to follow-up in August

## 2019-12-21 DIAGNOSIS — H5203 Hypermetropia, bilateral: Secondary | ICD-10-CM | POA: Diagnosis not present

## 2019-12-21 DIAGNOSIS — H52223 Regular astigmatism, bilateral: Secondary | ICD-10-CM | POA: Diagnosis not present

## 2019-12-21 DIAGNOSIS — H5034 Intermittent alternating exotropia: Secondary | ICD-10-CM | POA: Diagnosis not present

## 2020-01-05 ENCOUNTER — Encounter: Payer: 59 | Admitting: Family Medicine

## 2020-02-28 ENCOUNTER — Encounter: Payer: 59 | Attending: Surgery | Admitting: Skilled Nursing Facility1

## 2020-02-28 ENCOUNTER — Other Ambulatory Visit: Payer: Self-pay

## 2020-02-28 DIAGNOSIS — E669 Obesity, unspecified: Secondary | ICD-10-CM | POA: Diagnosis not present

## 2020-02-29 NOTE — Progress Notes (Signed)
Follow-up visit:  Post-Operative sleeve Surgery  Medical Nutrition Therapy:  Appt start time: 6:00pm end time:  7:00pm  Primary concerns today: Post-operative Bariatric Surgery Nutrition Management 6 Month Post-Op Class  Surgery date: 10/11/2019 Surgery type: sleeve Start weight at [facility]: 340.5 Weight today: 263.6   Body Composition Scale 10/25/2019 12/05/2019 02/29/2020  Total Body Fat % 36 33.9 29.9  Visceral Fat 28 25 20   Fat-Free Mass % 63.9 66 70   Total Body Water % 44.9 47 51   Muscle-Mass lbs 60 56 49.6  Body Fat Displacement            Torso  lbs 71.2 62.5 48.9         Left Leg  lbs 14.2 12.5 9.7         Right Leg  lbs 14.2 12.5 9.7         Left Arm  lbs 7.1 6.2 4.8         Right Arm   lbs 7.1 6.2 4.8      Information Reviewed/ Discussed During Appointment: -Review of composition scale numbers -Fluid requirements (64-100 ounces) -Protein requirements (60-80g) -Strategies for tolerating diet -Advancement of diet to include Starchy vegetables -Barriers to inclusion of new foods -Inclusion of appropriate multivitamin and calcium supplements  -Exercise recommendations   Fluid intake: adequate   Medications: See List Supplementation: appropriate    Using straws: no Drinking while eating: no Having you been chewing well: yes Chewing/swallowing difficulties: no Changes in vision: no Changes to mood/headaches: no Hair loss/Cahnges to skin/Changes to nails: no Any difficulty focusing or concentrating: no Sweating: no Dizziness/Lightheaded: no Palpitations: no  Carbonated beverages: no N/V/D/C/GAS: no Abdominal Pain: no Dumping syndrome: no  Recent physical activity:  ADL's  Progress Towards Goal(s):  In Progress  Handouts given during visit include:  Phase V diet Progression   Goals Sheet  The Benefits of Exercise are endless.....  Support Group Topics  Pt Chosen Goals: I will drink 64 ounces of any sugar free, carbonation free option 7 days a  week by (specific date) ___appt_____ Functional Goals: I will chew my food until ___________________________ every time I eat by (specific date) ____appt_______  I will take the stairs whenever available by (specific date) __appt____________________________   Teaching Method Utilized:  Visual Auditory Hands on  Demonstrated degree of understanding via:  Teach Back   Monitoring/Evaluation:  Dietary intake, exercise, and body weight. Follow up in 3 months for 9 month post-op visit.

## 2020-04-23 NOTE — Progress Notes (Signed)
Phone: [phone]    Subjective:  Patient presents today for their annual physical. Chief complaint-noted.   See problem oriented charting- ROS- full  review of systems was completed and negative per full ROS sheet  The following were reviewed and entered/updated in epic: Past Medical History:  Diagnosis Date  . Fatty liver   . GERD (gastroesophageal reflux disease)   . Hiatal hernia   . OSA (obstructive sleep apnea)    Patient Active Problem List   Diagnosis Date Noted  . Bone cyst, solitary 05/24/2015    Priority: Medium  . Hyperglycemia 05/24/2015    Priority: Medium  . Fatty liver     Priority: Medium  . OSA (obstructive sleep apnea) 01/01/2015    Priority: Medium  . Nausea without vomiting 05/24/2015    Priority: Low  . GERD (gastroesophageal reflux disease) 05/24/2015    Priority: Low  . Family history of colon cancer 05/24/2015    Priority: Low  . 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 DOE (dyspnea on exertion) 12/02/2015   Past Surgical History:  Procedure Laterality Date  . broken nose    . ct guided biopsy     cystic lesion- nondiagnostic but followed radiographically  . LAPAROSCOPIC GASTRIC SLEEVE RESECTION N/A 10/11/2019   Procedure: LAPAROSCOPIC GASTRIC SLEEVE RESECTION WITH HIATAL HERNIA REPAIR, Upper Endo, ERAS Pathway;  Surgeon: Luretha Murphy, MD;  Location: [facility];  Service: General;  Laterality: N/A;    Family History  Problem Relation Age of Onset  . Diabetes Mother        57s  . Hypertension Mother   . CVA Mother        20  . Colon cancer Father        mid 71s  . Liver cancer Father        decesed- age 71. NASH etiology  . Diabetes Father        73s  . Hypertension Father     Medications- reviewed and updated No current outpatient medications on file.   No current facility-administered medications for this visit.    Allergies-reviewed and updated No Known Allergies  Social  History   Social History Narrative   Married. 3 children 6.5 Iylah (sounds like isla)  and 3.46 years old [place] and 3 Naya in 2020.    Wife Magda is my patient.       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 Oropharynx normal Neck: no thyromegaly CV: RRR no murmurs rubs or gallops Lungs: CTAB no crackles, wheeze, rhonchi Abdomen: soft/nontender/nondistended/normal bowel sounds. No rebound or guarding.  Ext: no edema-improved from last year Skin: warm, dry Neuro: grossly normal, moves all extremities, PERRLA     Assessment and Plan:  44 y.o. male presenting for annual physical.  Health Maintenance counseling: 1. Anticipatory guidance: Patient counseled regarding regular dental exams q6 months, eye exams - no issues with vision,  avoiding smoking and second  hand smoke, limiting alcohol to 2 beverages per day.   2. Risk factor reduction:  Advised patient of need for regular exercise and diet rich and fruits and vegetables to reduce risk of heart attack and stroke. Exercise-  swims at community pool and looking at [facility]. Diet- sleeve gastrectomy 09/2019. Pushing for another 40 lbs - wants to get down to normal BMI.  Wt Readings from Last 3 Encounters:  04/24/20 255 lb 9.6 oz (115.9 kg)  02/29/20 263 lb 9.6 oz (119.6 kg)  12/05/19 297 lb 1.6 oz (134.8 kg)  3. Immunizations/screenings/ancillary studies- will get flu shot in hospital. Hep c screen with labs Immunization History  Administered Date(s) Administered  . Influenza,inj,Quad PF,6+ Mos 05/22/2019  . Influenza-Unspecified 04/30/2015  . PFIZER SARS-COV-2 Vaccination 07/08/2019,  07/26/2019, 03/29/2020  . Tdap 05/23/2012  4. Prostate cancer screening-   No family history, start at age 56 . No change in urinary symptoms.   5. Colon cancer screening - Father with colon cancer in 85s. He had colonoscopy 09/15/2014 and has been recalled- getting set up with Dr. Loreta Ave for early next year 6. Skin cancer screening/prevention- no dermatologist. advised regular sunscreen use. Denies worrisome, changing, or new skin lesions.  7. Testicular cancer screening- advised monthly self exams  8. STD screening- patient opts monogamous 9. Never smoker-   Status of chronic or acute concerns   Muscle spasms left lower leg since his surgery. Bariatric multivitamin and calcium citrate taking. Need to update bariatric labs regardless- ordered today  # Hyperglycemia/insulin resistance/prediabetes S:  Medication: none Exercise and diet-incredible weight loss as noted above with sleeve gastrectomy Lab Results  Component Value Date   HGBA1C 5.8 01/04/2019   HGBA1C 5.5 11/27/2016   HGBA1C 5.9 (H) 04/23/2015   A/P: Update A1c with labs-suspect substantial improvement with weight loss  #hyperlipidemia S: Medication: None Lab Results  Component Value Date   CHOL 180 01/04/2019   HDL 36.10 (L) 01/04/2019   LDLCALC 114 (H) 01/04/2019   TRIG 150.0 (H) 01/04/2019   CHOLHDL 5 01/04/2019   A/P: Hopeful for significant improvement after 100 pound weight loss  #GERD-resolved with sleeve gastrectomy.  Also reports hiatal hernia was fixed  #OSA-now off CPAP  Recommended follow up: 1 year physical Future Appointments  Date Time Provider Department Center  06/04/2020  2:00 PM Short, Cristine Polio, RD [facility]   Lab/Order associations:Non fasting   ICD-10-CM   1. Preventative health care  Z00.00 COMPLETE METABOLIC PANEL WITH GFR    Lipid panel    CBC with Differential/Platelet  2. Hyperglycemia  R73.9   3. Gastroesophageal reflux disease without esophagitis  K21.9   4. Hyperlipidemia,  unspecified hyperlipidemia type  E78.5 COMPLETE METABOLIC PANEL WITH GFR    Lipid panel    CBC with Differential/Platelet  5. Encounter for hepatitis C screening test for low risk patient  Z11.59 Hepatitis C antibody  6. Encounter for screening colonoscopy  Z12.11 Ambulatory referral to Gastroenterology    No orders of the defined types were placed in this encounter.   Return precautions advised.   Tana Conch, MD

## 2020-04-23 NOTE — Patient Instructions (Addendum)
Please stop by lab before you go If you have mychart- we will send your results within 3 business days of [place] receiving them.  If you do not have mychart- we will call you about results within 5 business days of [place] receiving them.  *please note we are currently using [facility] which has a longer processing time than [facility] typically so labs may not come back as quickly as in the past *please also note that you will see labs on mychart as soon as they post. I will later go in and write notes on them- will say "notes from Dr. Durene Cal"  Health Maintenance Due  Topic Date Due  . INFLUENZA VACCINE Declined in office flu shot today. He prefers to get it thru his job. Let us know when you get this 02/19/2020   congrats on your amazing efforts!

## 2020-04-24 ENCOUNTER — Other Ambulatory Visit: Payer: Self-pay

## 2020-04-24 ENCOUNTER — Encounter: Payer: Self-pay | Admitting: Family Medicine

## 2020-04-24 ENCOUNTER — Ambulatory Visit (INDEPENDENT_AMBULATORY_CARE_PROVIDER_SITE_OTHER): Payer: 59 | Admitting: Family Medicine

## 2020-04-24 VITALS — BP 122/80 | HR 62 | Temp 98.0°F | Resp 18 | Ht 73.0 in | Wt 255.6 lb

## 2020-04-24 DIAGNOSIS — Z1211 Encounter for screening for malignant neoplasm of colon: Secondary | ICD-10-CM | POA: Diagnosis not present

## 2020-04-24 DIAGNOSIS — R252 Cramp and spasm: Secondary | ICD-10-CM | POA: Diagnosis not present

## 2020-04-24 DIAGNOSIS — R739 Hyperglycemia, unspecified: Secondary | ICD-10-CM

## 2020-04-24 DIAGNOSIS — Z0000 Encounter for general adult medical examination without abnormal findings: Secondary | ICD-10-CM | POA: Diagnosis not present

## 2020-04-24 DIAGNOSIS — E785 Hyperlipidemia, unspecified: Secondary | ICD-10-CM

## 2020-04-24 DIAGNOSIS — Z9884 Bariatric surgery status: Secondary | ICD-10-CM | POA: Diagnosis not present

## 2020-04-24 DIAGNOSIS — K219 Gastro-esophageal reflux disease without esophagitis: Secondary | ICD-10-CM

## 2020-04-24 DIAGNOSIS — Z1159 Encounter for screening for other viral diseases: Secondary | ICD-10-CM | POA: Diagnosis not present

## 2020-04-27 LAB — CBC WITH DIFFERENTIAL/PLATELET
Absolute Monocytes: 490 cells/uL (ref 200–950)
Basophils Absolute: 68 cells/uL (ref 0–200)
Basophils Relative: 1 %
Eosinophils Absolute: 122 cells/uL (ref 15–500)
Eosinophils Relative: 1.8 %
HCT: 44.1 % (ref 38.5–50.0)
Hemoglobin: 14.8 g/dL (ref 13.2–17.1)
Lymphs Abs: 2557 cells/uL (ref 850–3900)
MCH: 29.7 pg (ref 27.0–33.0)
MCHC: 33.6 g/dL (ref 32.0–36.0)
MCV: 88.4 fL (ref 80.0–100.0)
MPV: 12.1 fL (ref 7.5–12.5)
Monocytes Relative: 7.2 %
Neutro Abs: 3563 cells/uL (ref 1500–7800)
Neutrophils Relative %: 52.4 %
Platelets: 183 10*3/uL (ref 140–400)
RBC: 4.99 10*6/uL (ref 4.20–5.80)
RDW: 13 % (ref 11.0–15.0)
Total Lymphocyte: 37.6 %
WBC: 6.8 10*3/uL (ref 3.8–10.8)

## 2020-04-27 LAB — COMPLETE METABOLIC PANEL WITH GFR
AG Ratio: 2.1 (calc) (ref 1.0–2.5)
ALT: 19 U/L (ref 9–46)
AST: 15 U/L (ref 10–40)
Albumin: 4.8 g/dL (ref 3.6–5.1)
Alkaline phosphatase (APISO): 68 U/L (ref 36–130)
BUN: 17 mg/dL (ref 7–25)
CO2: 29 mmol/L (ref 20–32)
Calcium: 10.3 mg/dL (ref 8.6–10.3)
Chloride: 105 mmol/L (ref 98–110)
Creat: 0.75 mg/dL (ref 0.60–1.35)
GFR, Est African American: 128 mL/min/{1.73_m2} (ref >=60)
GFR, Est Non African American: 111 mL/min/{1.73_m2} (ref >=60)
Globulin: 2.3 g/dL (calc) (ref 1.9–3.7)
Glucose, Bld: 85 mg/dL (ref 65–99)
Potassium: 4.1 mmol/L (ref 3.5–5.3)
Sodium: 143 mmol/L (ref 135–146)
Total Bilirubin: 0.7 mg/dL (ref 0.2–1.2)
Total Protein: 7.1 g/dL (ref 6.1–8.1)

## 2020-04-27 LAB — VITAMIN B1

## 2020-04-27 LAB — VITAMIN D 25 HYDROXY (VIT D DEFICIENCY, FRACTURES): Vit D, 25-Hydroxy: 46 ng/mL (ref 30–100)

## 2020-04-27 LAB — LIPID PANEL
Cholesterol: 193 mg/dL (ref <200)
HDL: 39 mg/dL — ABNORMAL LOW (ref >=40)
LDL Cholesterol (Calc): 134 mg/dL (calc) — ABNORMAL HIGH
Non-HDL Cholesterol (Calc): 154 mg/dL (calc) — ABNORMAL HIGH (ref <130)
Total CHOL/HDL Ratio: 4.9 (calc) (ref <5.0)
Triglycerides: 96 mg/dL (ref <150)

## 2020-04-27 LAB — HEMOGLOBIN A1C
Hgb A1c MFr Bld: 5 % of total Hgb (ref <5.7)
Mean Plasma Glucose: 97 (calc)
eAG (mmol/L): 5.4 (calc)

## 2020-04-27 LAB — VITAMIN B12: Vitamin B-12: 742 pg/mL (ref 200–1100)

## 2020-04-27 LAB — IRON,TIBC AND FERRITIN PANEL
%SAT: 22 % (calc) (ref 20–48)
Ferritin: 124 ng/mL (ref 38–380)
Iron: 72 ug/dL (ref 50–180)
TIBC: 324 mcg/dL (calc) (ref 250–425)

## 2020-04-27 LAB — FOLATE RBC: RBC Folate: 795 ng/mL RBC (ref >280)

## 2020-04-27 LAB — HEPATITIS C ANTIBODY
Hepatitis C Ab: NONREACTIVE
SIGNAL TO CUT-OFF: 0.03 (ref <1.00)

## 2020-05-17 ENCOUNTER — Encounter: Payer: Self-pay | Admitting: Family Medicine

## 2020-05-29 ENCOUNTER — Ambulatory Visit: Payer: 59 | Admitting: Dietician

## 2020-06-04 ENCOUNTER — Other Ambulatory Visit: Payer: Self-pay

## 2020-06-04 ENCOUNTER — Encounter: Payer: Self-pay | Admitting: Dietician

## 2020-06-04 ENCOUNTER — Encounter: Payer: 59 | Attending: Surgery | Admitting: Dietician

## 2020-06-04 DIAGNOSIS — Z9884 Bariatric surgery status: Secondary | ICD-10-CM | POA: Insufficient documentation

## 2020-06-04 NOTE — Progress Notes (Signed)
Bariatric Nutrition Follow-Up Visit Medical Nutrition Therapy  Appt Start Time: 2:05pm    End Time: 2:30pm  8 Months Post-Operative Sleeve Surgery Surgery Date: 10/11/2019  Pt's Expectations of Surgery/ Goals: to lose weight Pt Reported Successes: feels better, weight loss, more energy, able to eat/tolerate any food    NUTRITION ASSESSMENT  Anthropometrics  Start weight at [facility]: 340.5 lbs (date: 03/24/2019) Today's weight: 245 lbs  Body Composition Scale 10/25/19 12/05/19 02/29/20 06/04/20  Weight  lbs 318.2 297.1 263.6 245  BMI 42 39.2 34.8 32.1  Total Body Fat  % 36 33.9 29.9 27.3     Visceral Fat 28 25 20 17   Fat-Free Mass  % 63.9 66 70 72.6     Total Body Water  % 44.9 47 51 53.6     Muscle-Mass  lbs 60 56 49.6 46.1  Body Fat Displacement - - - ---         Torso  lbs 71.2 62.5 48.9 41.4         Left Leg  lbs 14.2 12.5 9.7 8.2         Right Leg  lbs 14.2 12.5 9.7 8.2         Left Arm  lbs 7.1 6.2 4.8 4.1         Right Arm  lbs 7.1 6.2 4.8 4.1    Lifestyle & Dietary Hx Pt has hx of kidney stones. States he was taking calcium citrate, however he had his levels checked recently and his serum calcium level was high, so he stopped taking calcium and states he plans to take 1 or 2 per day starting next month or so. States his constipation has improved. Meeting protein goal daily, states he knows he should drink more fluids. States he can tolerate foods and fluids fine, able to eat just about anything. States he is concerned because he can eat larger serving sizes at a time and does not feel full/ restriction, but he still limits himself.   24-Hr Dietary Recall First Meal: protein shake + coffee Snack: -  Second Meal: soup + salad  Snack: 2 hard boiled eggs + cheese  Third Meal: salad Snack: tuna + noodles + marinara sauce Beverages: coffee, herbal tea, protein shake, some water  Estimated daily fluid intake: ~64 oz Estimated daily protein intake: 80 g Supplements: bariatric  MVI Current average weekly physical activity: swimming    Post-Op Goals/ Signs/ Symptoms Using straws: no Drinking while eating: no Chewing/swallowing difficulties: no Changes in vision: no Changes to mood/headaches: no Hair loss/changes to skin/nails: no Difficulty focusing/concentrating: no Sweating: no Dizziness/lightheadedness: no Palpitations: no  Carbonated/caffeinated beverages: coffee N/V/D/C/Gas: no Abdominal pain: no Dumping syndrome: no   NUTRITION DIAGNOSIS  Overweight/obesity (A[place]-3.3) related to past poor dietary habits and physical inactivity as evidenced by completed bariatric surgery and following dietary guidelines for continued weight loss and healthy nutrition status.   NUTRITION INTERVENTION Nutrition counseling (C-1) and education (E-2) to facilitate bariatric surgery goals, including:  Diet education  The importance of consuming adequate calories as well as certain nutrients daily due to the body's need for essential vitamins, minerals, and fats  The importance of daily physical activity and to reach a goal of at least 150 minutes of moderate to vigorous physical activity weekly (or as directed by their physician) due to benefits such as increased musculature and improved lab values  Learning Style & Readiness for Change Teaching method utilized: Visual & Auditory  Demonstrated degree of understanding via: Teach  Back  Barriers to learning/adherence to lifestyle change: None Identified   MONITORING & EVALUATION Dietary intake, weekly physical activity, body weight, and goals in 4 months.  Next Steps Patient is to follow-up in 4 months for 12 month post-op follow-up.

## 2020-06-12 ENCOUNTER — Other Ambulatory Visit (HOSPITAL_COMMUNITY): Payer: Self-pay | Admitting: Internal Medicine

## 2020-06-12 MED FILL — NEOMYCIN-POLYMYXIN-HC EAR S: 3.5-10000-1 | 9 days supply | Qty: 10 | Fill #0

## 2020-07-04 DIAGNOSIS — H5330 Unspecified disorder of binocular vision: Secondary | ICD-10-CM | POA: Diagnosis not present

## 2020-07-04 DIAGNOSIS — H5034 Intermittent alternating exotropia: Secondary | ICD-10-CM | POA: Diagnosis not present

## 2020-07-30 ENCOUNTER — Other Ambulatory Visit: Payer: Self-pay | Admitting: Physician Assistant

## 2020-07-30 ENCOUNTER — Ambulatory Visit (HOSPITAL_COMMUNITY)
Admission: RE | Admit: 2020-07-30 | Discharge: 2020-07-30 | Disposition: A | Discharge status: Home / Self Care | Payer: 59 | Source: Ambulatory Visit | Attending: Pulmonary Disease | Admitting: Pulmonary Disease

## 2020-07-30 DIAGNOSIS — U071 COVID-19: Secondary | ICD-10-CM

## 2020-07-30 DIAGNOSIS — Z9884 Bariatric surgery status: Secondary | ICD-10-CM

## 2020-07-30 MED ORDER — EPINEPHRINE 0.3 MG/0.3ML IJ SOAJ: 0.3000 mg | Freq: Once | INTRAMUSCULAR | Status: DC | PRN

## 2020-07-30 MED ORDER — SODIUM CHLORIDE 0.9 % IV SOLN: INTRAVENOUS | Status: DC | PRN

## 2020-07-30 MED ORDER — SODIUM CHLORIDE 0.9 % IV SOLN
100.0000 mg | Freq: Once | INTRAVENOUS | Status: AC
  Administered 2020-07-30: 100 mg via INTRAVENOUS

## 2020-07-30 MED ORDER — FAMOTIDINE IN NACL 20-0.9 MG/50ML-% IV SOLN: 20.0000 mg | Freq: Once | INTRAVENOUS | Status: DC | PRN

## 2020-07-30 MED ORDER — DIPHENHYDRAMINE HCL 50 MG/ML IJ SOLN: 50.0000 mg | Freq: Once | INTRAMUSCULAR | Status: DC | PRN

## 2020-07-30 MED ORDER — METHYLPREDNISOLONE SODIUM SUCC 125 MG IJ SOLR: 125.0000 mg | Freq: Once | INTRAMUSCULAR | Status: DC | PRN

## 2020-07-30 MED ORDER — ALBUTEROL SULFATE HFA 108 (90 BASE) MCG/ACT IN AERS: 2.0000 | INHALATION_SPRAY | Freq: Once | RESPIRATORY_TRACT | Status: DC | PRN

## 2020-07-30 NOTE — Progress Notes (Signed)
  Diagnosis: COVID-19  Physician: Dr. Wright  Procedure: [facility] Med: remdesivir infusion - Provided patient with remdesivir fact sheet for patients, parents and caregivers prior to infusion.  Complications: No immediate complications noted.  Discharge: Discharged home   Charles David 07/30/2020   

## 2020-07-30 NOTE — Progress Notes (Signed)
I connected by phone with Charles David on 07/30/2020 at 11:31 AM to discuss the potential use of a new treatment for mild to moderate COVID-19 viral infection in non-hospitalized patients.  This patient is a 45 y.o. male that meets the FDA criteria for Emergency Use Authorization of COVID monoclonal antibody sotrovimab.  Has a (+) direct SARS-CoV-2 viral test result  Has mild or moderate COVID-19   Is NOT hospitalized due to COVID-19  Is within 10 days of symptom onset  Has at least one of the high risk factor(s) for progression to severe COVID-19 and/or hospitalization as defined in EUA.  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 FDA has authorized the emergency use for the treatment of mild to moderate COVID-19 in adults and pediatric patients with positive results of direct SARS-CoV-2 viral testing who are 67 years of age and older weighing at least 40 kg, and who are at high risk for progressing to severe COVID-19 and/or hospitalization.  2. The significant known and potential risks and benefits of COVID monoclonal antibody, and the extent to which such potential risks and benefits are unknown.  3. Information on available alternative treatments and the risks and benefits of those alternatives, including clinical trials.  4. Patients treated with COVID monoclonal antibody should continue to self-isolate and use infection control measures (e.g., wear mask, isolate, social distance, avoid sharing personal items, clean and disinfect "high touch" surfaces, and frequent handwashing) according to CDC guidelines.   5. The patient or parent/caregiver has the option to accept or refuse COVID monoclonal antibody treatment.  After reviewing this information with the patient, the patient has  agreed to receive one of the available covid 19 monoclonal antibodies and will be provided an appropriate fact sheet prior to infusion.   Sx onset 1/8. + home test. [name] and fully vaccinated.   Cline Crock, PA-C 07/30/2020 11:31 AM

## 2020-07-30 NOTE — Progress Notes (Signed)
Patient reviewed Fact Sheet for Patients, Parents, and Caregivers for Emergency Use Authorization (EUA) of remdesivir for the Treatment of Coronavirus. Patient also reviewed and is agreeable to the estimated cost of treatment. Patient is agreeable to proceed.    

## 2020-07-30 NOTE — Discharge Instructions (Signed)
10 Things You Can Do to Manage Your COVID-19 Symptoms at Home °If you have possible or confirmed COVID-19: °1. Stay home except to get medical care. °2. Monitor your symptoms carefully. If your symptoms get worse, call your healthcare provider immediately. °3. Get rest and stay hydrated. °4. If you have a medical appointment, call the healthcare provider ahead of time and tell them that you have or may have COVID-19. °5. For medical emergencies, call 911 and notify the dispatch personnel that you have or may have COVID-19. °6. Cover your cough and sneezes with a tissue or use the inside of your elbow. °7. Wash your hands often with soap and water for at least 20 seconds or clean your hands with an alcohol-based hand sanitizer that contains at least 60% alcohol. °8. As much as possible, stay in a specific room and away from other people in your home. Also, you should use a separate bathroom, if available. If you need to be around other people in or outside of the home, wear a mask. °9. Avoid sharing personal items with other people in your household, like dishes, towels, and bedding. °10. Clean all surfaces that are touched often, like counters, tabletops, and doorknobs. Use household cleaning sprays or wipes according to the label instructions. °cdc.gov/coronavirus °02/03/2020 °This information is not intended to replace advice given to you by your health care provider. Make sure you discuss any questions you have with your health care provider. °Document Revised: 05/21/2020 Document Reviewed: 05/21/2020 °Elsevier Patient Education © 2021 Elsevier Inc. ° ° ° °If you have any questions or concerns after the infusion please call the Advanced Practice Provider on call at [phone]. This number is ONLY intended for your use regarding questions or concerns about the infusion post-treatment side-effects.  Please do not provide this number to others for use. For return to work notes please contact your primary care  provider.  ° °If someone you know is interested in receiving treatment please have them call the COVID hotline at [phone]. ° ° ° °

## 2020-07-31 ENCOUNTER — Ambulatory Visit (HOSPITAL_COMMUNITY)
Admission: RE | Admit: 2020-07-31 | Discharge: 2020-07-31 | Disposition: A | Discharge status: Home / Self Care | Payer: 59 | Source: Ambulatory Visit | Attending: Pulmonary Disease | Admitting: Pulmonary Disease

## 2020-07-31 ENCOUNTER — Ambulatory Visit (HOSPITAL_COMMUNITY): Payer: 59

## 2020-07-31 ENCOUNTER — Encounter (HOSPITAL_COMMUNITY): Payer: Self-pay

## 2020-07-31 DIAGNOSIS — U071 COVID-19: Secondary | ICD-10-CM | POA: Diagnosis not present

## 2020-07-31 DIAGNOSIS — J1289 Other viral pneumonia: Secondary | ICD-10-CM | POA: Insufficient documentation

## 2020-07-31 DIAGNOSIS — Z6832 Body mass index (BMI) 32.0-32.9, adult: Secondary | ICD-10-CM | POA: Diagnosis not present

## 2020-07-31 DIAGNOSIS — Z9884 Bariatric surgery status: Secondary | ICD-10-CM | POA: Diagnosis not present

## 2020-07-31 MED ORDER — SODIUM CHLORIDE 0.9 % IV SOLN: INTRAVENOUS | Status: DC | PRN

## 2020-07-31 MED ORDER — SODIUM CHLORIDE 0.9 % IV SOLN
100.0000 mg | Freq: Once | INTRAVENOUS | Status: AC
  Administered 2020-07-31: 100 mg via INTRAVENOUS

## 2020-07-31 MED ORDER — ALBUTEROL SULFATE HFA 108 (90 BASE) MCG/ACT IN AERS: 2.0000 | INHALATION_SPRAY | Freq: Once | RESPIRATORY_TRACT | Status: DC | PRN

## 2020-07-31 MED ORDER — FAMOTIDINE IN NACL 20-0.9 MG/50ML-% IV SOLN: 20.0000 mg | Freq: Once | INTRAVENOUS | Status: DC | PRN

## 2020-07-31 MED ORDER — EPINEPHRINE 0.3 MG/0.3ML IJ SOAJ: 0.3000 mg | Freq: Once | INTRAMUSCULAR | Status: DC | PRN

## 2020-07-31 MED ORDER — DIPHENHYDRAMINE HCL 50 MG/ML IJ SOLN: 50.0000 mg | Freq: Once | INTRAMUSCULAR | Status: DC | PRN

## 2020-07-31 MED ORDER — METHYLPREDNISOLONE SODIUM SUCC 125 MG IJ SOLR: 125.0000 mg | Freq: Once | INTRAMUSCULAR | Status: DC | PRN

## 2020-07-31 NOTE — Discharge Instructions (Signed)

## 2020-07-31 NOTE — Progress Notes (Signed)
Patient reviewed Fact Sheet for Patients, Parents, and Caregivers for Emergency Use Authorization (EUA) of remdesivir for the Treatment of Coronavirus. Patient also reviewed and is agreeable to the estimated cost of treatment. Patient is agreeable to proceed.    

## 2020-07-31 NOTE — Progress Notes (Signed)
  Diagnosis: COVID-19  Physician:Dr Wright   Procedure: [facility] Infusion Clinic Med: remdesivir infusion - Provided patient with remdesivir fact sheet for patients, parents and caregivers prior to infusion.  Complications: No immediate complications noted.  Discharge: Discharged home   Olanrewaju Osborn W 07/31/2020  

## 2020-08-01 ENCOUNTER — Ambulatory Visit (HOSPITAL_COMMUNITY)
Admission: RE | Admit: 2020-08-01 | Discharge: 2020-08-01 | Disposition: A | Discharge status: Home / Self Care | Payer: 59 | Source: Ambulatory Visit | Attending: Pulmonary Disease | Admitting: Pulmonary Disease

## 2020-08-01 DIAGNOSIS — Z9884 Bariatric surgery status: Secondary | ICD-10-CM | POA: Diagnosis not present

## 2020-08-01 DIAGNOSIS — J1289 Other viral pneumonia: Secondary | ICD-10-CM | POA: Diagnosis not present

## 2020-08-01 DIAGNOSIS — U071 COVID-19: Secondary | ICD-10-CM | POA: Insufficient documentation

## 2020-08-01 DIAGNOSIS — Z6833 Body mass index (BMI) 33.0-33.9, adult: Secondary | ICD-10-CM | POA: Insufficient documentation

## 2020-08-01 MED ORDER — DIPHENHYDRAMINE HCL 50 MG/ML IJ SOLN: 50.0000 mg | Freq: Once | INTRAMUSCULAR | Status: DC | PRN

## 2020-08-01 MED ORDER — SODIUM CHLORIDE 0.9 % IV SOLN: INTRAVENOUS | Status: DC | PRN

## 2020-08-01 MED ORDER — SODIUM CHLORIDE 0.9 % IV SOLN
100.0000 mg | Freq: Once | INTRAVENOUS | Status: AC
  Administered 2020-08-01: 100 mg via INTRAVENOUS

## 2020-08-01 MED ORDER — METHYLPREDNISOLONE SODIUM SUCC 125 MG IJ SOLR: 125.0000 mg | Freq: Once | INTRAMUSCULAR | Status: DC | PRN

## 2020-08-01 MED ORDER — FAMOTIDINE IN NACL 20-0.9 MG/50ML-% IV SOLN: 20.0000 mg | Freq: Once | INTRAVENOUS | Status: DC | PRN

## 2020-08-01 MED ORDER — EPINEPHRINE 0.3 MG/0.3ML IJ SOAJ: 0.3000 mg | Freq: Once | INTRAMUSCULAR | Status: DC | PRN

## 2020-08-01 MED ORDER — ALBUTEROL SULFATE HFA 108 (90 BASE) MCG/ACT IN AERS: 2.0000 | INHALATION_SPRAY | Freq: Once | RESPIRATORY_TRACT | Status: DC | PRN

## 2020-08-01 NOTE — Progress Notes (Signed)
Patient reviewed Fact Sheet for Patients, Parents, and Caregivers for Emergency Use Authorization (EUA) of remdesivir for the Treatment of Coronavirus. Patient also reviewed and is agreeable to the estimated cost of treatment. Patient is agreeable to proceed.    

## 2020-08-01 NOTE — Discharge Instructions (Signed)
10 Things You Can Do to Manage Your COVID-19 Symptoms at Home °If you have possible or confirmed COVID-19: °1. Stay home except to get medical care. °2. Monitor your symptoms carefully. If your symptoms get worse, call your healthcare provider immediately. °3. Get rest and stay hydrated. °4. If you have a medical appointment, call the healthcare provider ahead of time and tell them that you have or may have COVID-19. °5. For medical emergencies, call 911 and notify the dispatch personnel that you have or may have COVID-19. °6. Cover your cough and sneezes with a tissue or use the inside of your elbow. °7. Wash your hands often with soap and water for at least 20 seconds or clean your hands with an alcohol-based hand sanitizer that contains at least 60% alcohol. °8. As much as possible, stay in a specific room and away from other people in your home. Also, you should use a separate bathroom, if available. If you need to be around other people in or outside of the home, wear a mask. °9. Avoid sharing personal items with other people in your household, like dishes, towels, and bedding. °10. Clean all surfaces that are touched often, like counters, tabletops, and doorknobs. Use household cleaning sprays or wipes according to the label instructions. °cdc.gov/coronavirus °02/03/2020 °This information is not intended to replace advice given to you by your health care provider. Make sure you discuss any questions you have with your health care provider. °Document Revised: 05/21/2020 Document Reviewed: 05/21/2020 °Elsevier Patient Education © 2021 Elsevier Inc. °If you have any questions or concerns after the infusion please call the Advanced Practice Provider on call at [phone]. This number is ONLY intended for your use regarding questions or concerns about the infusion post-treatment side-effects.  Please do not provide this number to others for use. For return to work notes please contact your primary care provider.   ° °If someone you know is interested in receiving treatment please have them contact their MD for a referral or visit [email or web address] ° ° ° °

## 2020-08-01 NOTE — Progress Notes (Signed)
  Diagnosis: COVID-19  Physician: Dr. Wright  Procedure: [facility] Infusion Clinic Med: remdesivir infusion - Provided patient with remdesivir fact sheet for patients, parents and caregivers prior to infusion.  Complications: No immediate complications noted.  Discharge: Discharged home   Charles David 08/01/2020   

## 2020-08-29 NOTE — Addendum Note (Signed)
Encounter addended by: Marilynn Rail, RN on: 08/29/2020 5:59 PM  Actions taken: Charge Capture section accepted

## 2020-09-05 DIAGNOSIS — Z1159 Encounter for screening for other viral diseases: Secondary | ICD-10-CM | POA: Diagnosis not present

## 2020-09-25 ENCOUNTER — Ambulatory Visit: Payer: 59

## 2020-10-23 ENCOUNTER — Other Ambulatory Visit: Payer: Self-pay

## 2020-10-23 ENCOUNTER — Ambulatory Visit (INDEPENDENT_AMBULATORY_CARE_PROVIDER_SITE_OTHER): Payer: 59

## 2020-10-23 DIAGNOSIS — Z23 Encounter for immunization: Secondary | ICD-10-CM | POA: Diagnosis not present

## 2020-10-23 DIAGNOSIS — T148XXA Other injury of unspecified body region, initial encounter: Secondary | ICD-10-CM

## 2020-10-26 ENCOUNTER — Other Ambulatory Visit (HOSPITAL_COMMUNITY): Payer: Self-pay | Admitting: Emergency Medicine

## 2020-11-06 ENCOUNTER — Encounter: Payer: 59 | Attending: Surgery | Admitting: Skilled Nursing Facility1

## 2020-11-06 ENCOUNTER — Other Ambulatory Visit: Payer: Self-pay

## 2020-11-06 ENCOUNTER — Ambulatory Visit (HOSPITAL_BASED_OUTPATIENT_CLINIC_OR_DEPARTMENT_OTHER)
Admission: RE | Admit: 2020-11-06 | Discharge: 2020-11-06 | Disposition: A | Discharge status: Home / Self Care | Payer: 59 | Source: Ambulatory Visit | Attending: Cardiology | Admitting: Cardiology

## 2020-11-06 DIAGNOSIS — E669 Obesity, unspecified: Secondary | ICD-10-CM | POA: Insufficient documentation

## 2020-11-07 NOTE — Progress Notes (Signed)
Bariatric Class:  Appt start time: 6:00 end time: 7:00  12 Month Post-Operative Nutrition Class  Patient was seen on 11/07/2020 for Post-Operative Nutrition education at [facility].   Surgery date: 10/11/2019 Surgery type: sleeve Start weight at [facility]: 340.5  Body Composition Scale 11/07/2020  Total Body Fat % 23.7  Visceral Fat 14  Fat-Free Mass % 76.2   Total Body Water % 57.2   Muscle-Mass lbs 42.1  Body Fat Displacement          Torso  lbs 32.9         Left Leg  lbs 6.5         Right Leg  lbs 6.5         Left Arm  lbs 3.2         Right Arm   lbs 3.2    The following the learning objectives were met by the patient during this course:  Review of body comp scale information  Share and discuss bariatric surgery successes and non-scale victories  Identifies Phase VII (Maintenance Phase) Dietary Goals which will be lifelong  Identifies appropriate sources of fluids, proteins, non-starchy vegetables, and complex carbohydrates  Identifies well-balanced meals  Identifies portion control   Identifies appropriate multivitamin and calcium sources post-operatively  Describes the need for physical activity post-operatively and will follow MD recommendations  Identifies and describes SMART goals   Creates at least 2 SMART goals to begin immediately  States when to call healthcare provider regarding medication questions or post-operative complications  Teaching method utilized: Visual & Auditory  Demonstrated degree of understanding via: Teach Back  Readiness Level: Action Barriers to learning/adherence to lifestyle change: None Identified  Handouts given during class include:  Phase VII: Maintenance Phase-Lifelong  Follow-Up Plan: Patient will follow-up at [facility] for on-going post-op nutrition visits.

## 2021-03-12 ENCOUNTER — Other Ambulatory Visit (HOSPITAL_COMMUNITY): Payer: Self-pay

## 2021-03-12 DIAGNOSIS — K5900 Constipation, unspecified: Secondary | ICD-10-CM | POA: Diagnosis not present

## 2021-03-12 DIAGNOSIS — K7581 Nonalcoholic steatohepatitis (NASH): Secondary | ICD-10-CM | POA: Diagnosis not present

## 2021-03-12 DIAGNOSIS — Z800 Family history of malignant neoplasm of digestive organs: Secondary | ICD-10-CM | POA: Diagnosis not present

## 2021-03-12 DIAGNOSIS — E669 Obesity, unspecified: Secondary | ICD-10-CM | POA: Diagnosis not present

## 2021-03-12 DIAGNOSIS — Z1211 Encounter for screening for malignant neoplasm of colon: Secondary | ICD-10-CM | POA: Diagnosis not present

## 2021-03-12 MED ORDER — CLENPIQ 10-3.5-12 MG-GM -GM/160ML PO SOLN
ORAL | 0 refills | Status: DC
  Filled 2021-03-12 – 2021-04-15 (×3): qty 320, 1d supply, fill #0

## 2021-03-20 ENCOUNTER — Other Ambulatory Visit (HOSPITAL_COMMUNITY): Payer: Self-pay

## 2021-04-05 ENCOUNTER — Other Ambulatory Visit (HOSPITAL_COMMUNITY): Payer: Self-pay

## 2021-04-15 ENCOUNTER — Other Ambulatory Visit (HOSPITAL_COMMUNITY): Payer: Self-pay

## 2021-04-19 ENCOUNTER — Other Ambulatory Visit (HOSPITAL_COMMUNITY): Payer: Self-pay

## 2021-04-19 MED ORDER — AMOXICILLIN-POT CLAVULANATE 875-125 MG PO TABS
1.0000 | ORAL_TABLET | Freq: Two times a day (BID) | ORAL | 0 refills | Status: DC
  Filled 2021-04-19: qty 20, 10d supply, fill #0

## 2021-04-29 ENCOUNTER — Encounter: Payer: 59 | Admitting: Family Medicine

## 2021-05-06 DIAGNOSIS — Z1211 Encounter for screening for malignant neoplasm of colon: Secondary | ICD-10-CM | POA: Diagnosis not present

## 2021-05-06 DIAGNOSIS — Z800 Family history of malignant neoplasm of digestive organs: Secondary | ICD-10-CM | POA: Diagnosis not present

## 2021-05-09 ENCOUNTER — Encounter (HOSPITAL_COMMUNITY): Payer: Self-pay | Admitting: *Deleted

## 2021-05-17 NOTE — Progress Notes (Signed)
Phone: [phone]    Subjective:  Patient presents today for their annual physical. Chief complaint-noted.   See problem oriented charting- Review of Systems  Constitutional:  Negative for chills and fever.  HENT:  Negative for nosebleeds and sinus pain.   Eyes:  Negative for blurred vision and double vision.  Respiratory:  Negative for cough and shortness of breath.   Cardiovascular:  Negative for chest pain and palpitations.  Gastrointestinal:  Negative for abdominal pain, blood in stool, constipation, diarrhea, heartburn, melena, nausea and vomiting.  Genitourinary:  Negative for dysuria and frequency.  Musculoskeletal:  Negative for back pain, myalgias and neck pain.  Skin:  Negative for rash.  Neurological:  Negative for dizziness and headaches.  Endo/Heme/Allergies:  Positive for polydipsia. Bruises/bleeds easily.  Psychiatric/Behavioral:  Positive for depression and suicidal ideas.    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    Priority: Medium    Hyperglycemia 05/24/2015    Priority: Medium    Fatty liver     Priority: Medium    OSA (obstructive sleep apnea) 01/01/2015    Priority: Medium    Nausea without vomiting 05/24/2015    Priority: Low   GERD (gastroesophageal reflux disease) 05/24/2015    Priority: Low   Family history of colon cancer 05/24/2015    Priority: Low   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 10/11/2019   DOE (dyspnea on exertion) 12/02/2015   Past Surgical History:  Procedure Laterality Date   broken nose     ct guided biopsy     cystic lesion- nondiagnostic but followed radiographically   LAPAROSCOPIC GASTRIC SLEEVE RESECTION N/A 10/11/2019   Procedure:  LAPAROSCOPIC GASTRIC SLEEVE RESECTION WITH HIATAL HERNIA REPAIR, Upper Endo, ERAS Pathway;  Surgeon: Luretha Murphy, MD;  Location: [facility];  Service: General;  Laterality: N/A;    Family History  Problem Relation Age of Onset   Diabetes Mother        7s   Hypertension Mother    CVA Mother        64   Colon cancer Father        mid 50s   Liver cancer Father        decesed- age 67. NASH etiology   Diabetes Father        82s   Hypertension Father     Medications- reviewed and updated Current Outpatient Medications  Medication Sig Dispense Refill   Multiple Vitamin (MULTIVITAMIN) capsule Take 1 capsule by mouth daily.     No current facility-administered medications for this visit.    Allergies-reviewed and updated No Known Allergies  Social History   Social History Narrative   Married. 3 children 6.5 Iylah (sounds like isla)  and 26.47 years old [place] and 3 Naya in 2020.    Wife Magda is my patient.       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 Oropharynx normal Neck: no thyromegaly CV: RRR no murmurs rubs or gallops Lungs: CTAB no crackles, wheeze, rhonchi Abdomen: soft/nontender/nondistended/normal bowel sounds. No rebound or guarding.  Ext: no edema Skin: warm, dry Neuro: grossly normal, moves all extremities, PERRLA    Assessment and Plan:  45 y.o. male presenting for annual physical.  Health Maintenance counseling: 1. Anticipatory guidance: Patient counseled regarding regular dental exams -q6 months, eye exams -no issues with vision,  avoiding  smoking and second hand smoke , limiting alcohol to 2 beverages per day- none due to faith, no illicit drugs.   2. Risk factor reduction:  Advised patient of need for regular exercise and diet rich and fruits and vegetables to reduce risk of heart attack and stroke. Exercise-  swims at community pool and looking at [facility]- last week trying to restart- had been off 8-9 months.  Diet- sleeve gastrectomy 09/2019. Pushing for another 40 lbs - wants to get down to normal BMI-weight down 2 pounds from last year. up 20 lbs over 6 months- starting to lose weight again- work on portion size. Lowest he has gotten was 219 Wt Readings from Last 3 Encounters:  05/21/21 243 lb 4 oz (110.3 kg)  11/07/20 224 lb (101.6 kg)  06/04/20 245 lb (111.1 kg)  3. Immunizations/screenings/ancillary studies DISCUSSED:  -Prevnar-20 vaccination #1 - recommended due to fatty liver- he wants to hold off -covid booster vaccination  - discussed option of bivalent vaccination- wants to hold off -flu vaccination (last one 10/21) - has already received with work Immunization History  Administered Date(s) Administered   Influenza,inj,Quad PF,6+ Mos 05/22/2019   Influenza-Unspecified 04/30/2015, 05/12/2020, 04/15/2021   PFIZER(Purple Top)SARS-COV-2 Vaccination 07/08/2019, 07/26/2019, 03/29/2020   Tdap 05/23/2012, 10/23/2020   4. Prostate cancer screening- No family history, start at age 65 . No change in urinary symptoms.    5. Colon cancer screening -  Father with colon cancer in 47s. He had colonoscopy 09/15/2014 and has been recalled-plan was for colonoscopy earlier this year- just had done recently- we are going to try to get records 6. Skin cancer screening/prevention- no dermatologist. advised regular sunscreen use. Denies worrisome, changing, or new skin lesions.  7. Testicular cancer screening- advised monthly self exams  8. STD screening- patient opts monogamous 9. Smoking associated screening- Never smoker  Status of chronic  or acute concerns   # social update- wife working as NP with 2 part time jobs  #Muscle spasms left lower leg since his surgery- better overall- worse with more weight loss or struggling with fluids. Bariatric multivitamin and calcium citrate taking.   # Hyperglycemia/insulin resistance/prediabetes S:  Medication: none Exercise and diet- see above Lab Results  Component Value Date   HGBA1C 5.0 04/24/2020   HGBA1C 5.8 01/04/2019   HGBA1C 5.5 11/27/2016  A/P: A1c plummeted after gastric sleeve-offered repeat- agrees to this    #hyperlipidemia-coronary artery calcium scoring of 0 on 11/06/2020 S: Medication: None Lab Results  Component Value Date   CHOL 193 04/24/2020   HDL 39 (L) 04/24/2020   LDLCALC 134 (H) 04/24/2020   TRIG 96 04/24/2020   CHOLHDL 4.9 04/24/2020   A/P: lipids mildly elevated but coronary artery calcium score of 0 - continue to work lifestyle   #  GERD-resolved with sleeve gastrectomy.  Also reported hiatal hernia was fixed   #OSA- has been off CPAP since weight loss  Recommended follow up: Return in about 1 year (around 05/21/2022) for physical or sooner if needed.  Lab/Order associations: fasting   ICD-10-CM   1. Preventative health care  Z00.00 CBC with Differential/Platelet    Comprehensive metabolic panel    Lipid panel    Hemoglobin A1c    Vitamin B12    VITAMIN D 25 Hydroxy (Vit-D Deficiency, Fractures)    PTH, intact and calcium    Vitamin B1    Folate RBC    Zinc    Copper, serum    Iron, TIBC and Ferritin Panel    2. Hyperlipidemia, unspecified hyperlipidemia type  E78.5 CBC with Differential/Platelet    Comprehensive metabolic panel    Lipid panel    3. Hyperglycemia  R73.9 Hemoglobin A1c    4. Need for immunization against influenza  Z23     5. OSA (obstructive sleep apnea)  G47.33     6. 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 Bariatric surgery status  Z98.84 Vitamin B12    VITAMIN D 25 Hydroxy (Vit-D Deficiency, Fractures)    PTH, intact and calcium    Vitamin B1    Folate RBC    Zinc    Copper, serum    Iron, TIBC and Ferritin Panel      No orders of the defined types were placed in this encounter.   I,Jada Bradford,acting as a scribe for Tana Conch, MD.,have documented all relevant documentation on the behalf of Tana Conch, MD,as directed by  Tana Conch, MD while in the presence of Tana Conch, MD.  I, Tana Conch, MD, have reviewed all documentation for this visit. The documentation on 05/21/21 for the exam, diagnosis, procedures, and orders are all accurate and complete.  Return precautions advised.   Tana Conch, MD

## 2021-05-21 ENCOUNTER — Other Ambulatory Visit: Payer: Self-pay

## 2021-05-21 ENCOUNTER — Encounter: Payer: Self-pay | Admitting: Family Medicine

## 2021-05-21 ENCOUNTER — Ambulatory Visit (INDEPENDENT_AMBULATORY_CARE_PROVIDER_SITE_OTHER): Payer: 59 | Admitting: Family Medicine

## 2021-05-21 VITALS — BP 118/70 | HR 63 | Temp 97.9°F | Ht 73.0 in | Wt 243.2 lb

## 2021-05-21 DIAGNOSIS — Z0000 Encounter for general adult medical examination without abnormal findings: Secondary | ICD-10-CM

## 2021-05-21 DIAGNOSIS — Z23 Encounter for immunization: Secondary | ICD-10-CM

## 2021-05-21 DIAGNOSIS — G4733 Obstructive sleep apnea (adult) (pediatric): Secondary | ICD-10-CM | POA: Diagnosis not present

## 2021-05-21 DIAGNOSIS — Z9884 Bariatric surgery status: Secondary | ICD-10-CM | POA: Diagnosis not present

## 2021-05-21 DIAGNOSIS — R739 Hyperglycemia, unspecified: Secondary | ICD-10-CM | POA: Diagnosis not present

## 2021-05-21 DIAGNOSIS — E785 Hyperlipidemia, unspecified: Secondary | ICD-10-CM | POA: Diagnosis not present

## 2021-05-21 LAB — CBC WITH DIFFERENTIAL/PLATELET
Basophils Absolute: 0 10*3/uL (ref 0.0–0.1)
Basophils Relative: 0.5 % (ref 0.0–3.0)
Eosinophils Absolute: 0.1 10*3/uL (ref 0.0–0.7)
Eosinophils Relative: 1.6 % (ref 0.0–5.0)
HCT: 44.6 % (ref 39.0–52.0)
Hemoglobin: 15.1 g/dL (ref 13.0–17.0)
Lymphocytes Relative: 41.4 % (ref 12.0–46.0)
Lymphs Abs: 2.3 10*3/uL (ref 0.7–4.0)
MCHC: 33.8 g/dL (ref 30.0–36.0)
MCV: 89.9 fl (ref 78.0–100.0)
Monocytes Absolute: 0.4 10*3/uL (ref 0.1–1.0)
Monocytes Relative: 6.9 % (ref 3.0–12.0)
Neutro Abs: 2.8 10*3/uL (ref 1.4–7.7)
Neutrophils Relative %: 49.6 % (ref 43.0–77.0)
Platelets: 180 10*3/uL (ref 150.0–400.0)
RBC: 4.97 Mil/uL (ref 4.22–5.81)
RDW: 13.2 % (ref 11.5–15.5)
WBC: 5.6 10*3/uL (ref 4.0–10.5)

## 2021-05-21 LAB — COMPREHENSIVE METABOLIC PANEL
ALT: 17 U/L (ref 0–53)
AST: 15 U/L (ref 0–37)
Albumin: 4.8 g/dL (ref 3.5–5.2)
Alkaline Phosphatase: 56 U/L (ref 39–117)
BUN: 14 mg/dL (ref 6–23)
CO2: 33 mEq/L — ABNORMAL HIGH (ref 19–32)
Calcium: 9.7 mg/dL (ref 8.4–10.5)
Chloride: 106 mEq/L (ref 96–112)
Creatinine, Ser: 0.79 mg/dL (ref 0.40–1.50)
GFR: 106.17 mL/min (ref >60.00)
Glucose, Bld: 89 mg/dL (ref 70–99)
Potassium: 4.6 mEq/L (ref 3.5–5.1)
Sodium: 146 mEq/L — ABNORMAL HIGH (ref 135–145)
Total Bilirubin: 0.8 mg/dL (ref 0.2–1.2)
Total Protein: 6.9 g/dL (ref 6.0–8.3)

## 2021-05-21 LAB — VITAMIN D 25 HYDROXY (VIT D DEFICIENCY, FRACTURES): VITD: 37.39 ng/mL (ref 30.00–100.00)

## 2021-05-21 LAB — LIPID PANEL
Cholesterol: 176 mg/dL (ref 0–200)
HDL: 46.2 mg/dL (ref >39.00)
LDL Cholesterol: 119 mg/dL — ABNORMAL HIGH (ref 0–99)
NonHDL: 130.11
Total CHOL/HDL Ratio: 4
Triglycerides: 55 mg/dL (ref 0.0–149.0)
VLDL: 11 mg/dL (ref 0.0–40.0)

## 2021-05-21 LAB — HEMOGLOBIN A1C: Hgb A1c MFr Bld: 5 % (ref 4.6–6.5)

## 2021-05-21 LAB — VITAMIN B12: Vitamin B-12: 554 pg/mL (ref 211–911)

## 2021-05-21 NOTE — Patient Instructions (Addendum)
Health Maintenance Due  Topic Date Due   Pneumococcal Vaccine 80-47 Years old (1 - PCV)- hold off for now Never done   COLONOSCOPY -Sign release of information at the check out desk for colonoscopy from Dr. Loreta Ave  09/16/2019   COVID-19 Vaccine (4 - Booster for Pfizer series)- hold off for now 05/24/2020   Please stop by lab before you go If you have mychart- we will send your results within 3 business days of [place] receiving them.  If you do not have mychart- we will call you about results within 5 business days of [place] receiving them.  *please also note that you will see labs on mychart as soon as they post. I will later go in and write notes on them- will say "notes from Dr. Durene Cal"   Recommended follow up: Return in about 1 year (around 05/21/2022) for physical or sooner if needed.

## 2021-05-26 LAB — EXTRA SPECIMEN

## 2021-05-26 LAB — PTH, INTACT AND CALCIUM
Calcium: 9.8 mg/dL (ref 8.6–10.3)
PTH: 35 pg/mL (ref 16–77)

## 2021-05-26 LAB — IRON,TIBC AND FERRITIN PANEL
%SAT: 30 % (calc) (ref 20–48)
Ferritin: 56 ng/mL (ref 38–380)
Iron: 86 ug/dL (ref 50–180)
TIBC: 289 mcg/dL (calc) (ref 250–425)

## 2021-05-26 LAB — VITAMIN B1: Vitamin B1 (Thiamine): 16 nmol/L (ref 8–30)

## 2021-05-26 LAB — FOLATE RBC: RBC Folate: 645 ng/mL RBC (ref >280)

## 2021-05-26 LAB — ZINC: Zinc: 112 ug/dL (ref 60–130)

## 2021-05-26 LAB — COPPER, SERUM: Copper: 93 ug/dL (ref 70–175)

## 2021-06-17 DIAGNOSIS — Z8739 Personal history of other diseases of the musculoskeletal system and connective tissue: Secondary | ICD-10-CM | POA: Diagnosis not present

## 2021-06-17 DIAGNOSIS — M899 Disorder of bone, unspecified: Secondary | ICD-10-CM | POA: Diagnosis not present

## 2021-06-21 ENCOUNTER — Other Ambulatory Visit (HOSPITAL_COMMUNITY): Payer: Self-pay | Admitting: Orthopedic Surgery

## 2021-06-21 DIAGNOSIS — M899 Disorder of bone, unspecified: Secondary | ICD-10-CM

## 2021-06-21 DIAGNOSIS — Z8739 Personal history of other diseases of the musculoskeletal system and connective tissue: Secondary | ICD-10-CM

## 2021-07-01 ENCOUNTER — Ambulatory Visit (HOSPITAL_COMMUNITY)
Admission: RE | Admit: 2021-07-01 | Discharge: 2021-07-01 | Disposition: A | Discharge status: Home / Self Care | Payer: 59 | Source: Ambulatory Visit | Attending: Orthopedic Surgery | Admitting: Orthopedic Surgery

## 2021-07-01 ENCOUNTER — Other Ambulatory Visit: Payer: Self-pay

## 2021-07-01 DIAGNOSIS — M85452 Solitary bone cyst, left pelvis: Secondary | ICD-10-CM | POA: Diagnosis not present

## 2021-07-01 DIAGNOSIS — M899 Disorder of bone, unspecified: Secondary | ICD-10-CM | POA: Insufficient documentation

## 2021-07-01 DIAGNOSIS — M160 Bilateral primary osteoarthritis of hip: Secondary | ICD-10-CM | POA: Diagnosis not present

## 2021-07-01 DIAGNOSIS — Z8739 Personal history of other diseases of the musculoskeletal system and connective tissue: Secondary | ICD-10-CM | POA: Insufficient documentation

## 2021-07-01 DIAGNOSIS — K639 Disease of intestine, unspecified: Secondary | ICD-10-CM | POA: Diagnosis not present

## 2021-07-01 MED ORDER — GADOBUTROL 1 MMOL/ML IV SOLN
10.0000 mL | Freq: Once | INTRAVENOUS | Status: AC | PRN
  Administered 2021-07-01: 10 mL via INTRAVENOUS

## 2021-08-12 DIAGNOSIS — M899 Disorder of bone, unspecified: Secondary | ICD-10-CM | POA: Diagnosis not present

## 2021-08-12 DIAGNOSIS — Z8739 Personal history of other diseases of the musculoskeletal system and connective tissue: Secondary | ICD-10-CM | POA: Diagnosis not present

## 2021-10-22 ENCOUNTER — Other Ambulatory Visit (HOSPITAL_COMMUNITY): Payer: Self-pay

## 2021-10-22 MED ORDER — AMOXICILLIN-POT CLAVULANATE 875-125 MG PO TABS
1.0000 | ORAL_TABLET | Freq: Two times a day (BID) | ORAL | 0 refills | Status: AC
  Filled 2021-10-22: qty 20, 10d supply, fill #0

## 2021-12-18 ENCOUNTER — Other Ambulatory Visit (HOSPITAL_COMMUNITY): Payer: Self-pay

## 2021-12-18 ENCOUNTER — Ambulatory Visit: Payer: 59 | Admitting: Family Medicine

## 2021-12-18 ENCOUNTER — Encounter: Payer: Self-pay | Admitting: Family Medicine

## 2021-12-18 VITALS — BP 122/70 | HR 88 | Temp 97.8°F | Ht 73.0 in | Wt 250.6 lb

## 2021-12-18 DIAGNOSIS — J020 Streptococcal pharyngitis: Secondary | ICD-10-CM | POA: Diagnosis not present

## 2021-12-18 LAB — POCT RAPID STREP A (OFFICE): Rapid Strep A Screen: POSITIVE — AB

## 2021-12-18 MED ORDER — PENICILLIN V POTASSIUM 500 MG PO TABS
500.0000 mg | ORAL_TABLET | Freq: Two times a day (BID) | ORAL | 0 refills | Status: AC
  Filled 2021-12-18: qty 20, 10d supply, fill #0

## 2021-12-18 NOTE — Patient Instructions (Signed)
No recentPlease follow up if symptoms do not improve or as needed.    Strep Throat, Adult Strep throat is an infection in the throat that is caused by bacteria. It is common during the cold months of the year. It mostly affects children who are 69-48 years old. However, people of all ages can get it at any time of the year. This infection spreads from person to person (is contagious) through coughing, sneezing, or having close contact. Your health care provider may use other names to describe the infection. When strep throat affects the tonsils, it is called tonsillitis. When it affects the back of the throat, it is called pharyngitis. What are the causes? This condition is caused by the Streptococcus pyogenes bacteria. What increases the risk? You are more likely to develop this condition if: You care for school-age children, or are around school-age children. Children are more likely to get strep throat and may spread it to others. You spend time in crowded places where the infection can spread easily. You have close contact with someone who has strep throat. What are the signs or symptoms? Symptoms of this condition include: Fever or chills. Redness, swelling, or pain in the tonsils or throat. Pain or difficulty when swallowing. White or yellow spots on the tonsils or throat. Tender glands in the neck and under the jaw. Bad smelling breath. Red rash all over the body. This is rare. How is this diagnosed? This condition is diagnosed by tests that check for the presence and the amount of bacteria that cause strep throat. They are: Rapid strep test. Your throat is swabbed and checked for the presence of bacteria. Results are usually ready in minutes. Throat culture test. Your throat is swabbed. The sample is placed in a cup that allows infections to grow. Results are usually ready in 1 or 2 days. How is this treated? This condition may be treated with: Medicines that kill germs  (antibiotics). Medicines that relieve pain or fever. These include: Ibuprofen or acetaminophen. Aspirin, only for people who are over the age of 48. Throat lozenges. Throat sprays. Follow these instructions at home: Medicines  Take over-the-counter and prescription medicines only as told by your health care provider. Take your antibiotic medicine as told by your health care provider. Do not stop taking the antibiotic even if you start to feel better. Eating and drinking  If you have trouble swallowing, try eating soft foods until your sore throat feels better. Drink enough fluid to keep your urine pale yellow. To help relieve pain, you may have: Warm fluids, such as soup and tea. Cold fluids, such as frozen desserts or popsicles. General instructions Gargle with a salt-water mixture 3-4 times a day or as needed. To make a salt-water mixture, completely dissolve -1 tsp (3-6 g) of salt in 1 cup (237 mL) of warm water. Get plenty of rest. Stay home from work or school until you have been taking antibiotics for 24 hours. Do not use any products that contain nicotine or tobacco. These products include cigarettes, chewing tobacco, and vaping devices, such as e-cigarettes. If you need help quitting, ask your health care provider. It is up to you to get your test results. Ask your health care provider, or the department that is doing the test, when your results will be ready. Keep all follow-up visits. This is important. How is this prevented?  Do not share food, drinking cups, or personal items that could cause the infection to spread to other people.  Wash your hands often with soap and water for at least 20 seconds. If soap and water are not available, use hand sanitizer. Make sure that all people in your house wash their hands well. Have family members tested if they have a sore throat or fever. They may need an antibiotic if they have strep throat. Contact a health care provider if: You  have swelling in your neck that keeps getting bigger. You develop a rash, cough, or earache. You cough up a thick mucus that is green, yellow-brown, or bloody. You have pain or discomfort that does not get better with medicine. Your symptoms seem to be getting worse. You have a fever. Get help right away if: You have new symptoms, such as vomiting, severe headache, stiff or painful neck, chest pain, or shortness of breath. You have severe throat pain, drooling, or changes in your voice. You have swelling of the neck, or the skin on the neck becomes red and tender. You have signs of dehydration, such as tiredness (fatigue), dry mouth, and decreased urination. You become increasingly sleepy, or you cannot wake up completely. Your joints become red or painful. These symptoms may represent a serious problem that is an emergency. Do not wait to see if the symptoms will go away. Get medical help right away. Call your local emergency services (911 in the [place] Do not drive yourself to the hospital. Summary Strep throat is an infection in the throat that is caused by the Streptococcus pyogenes bacteria. This infection is spread from person to person (is contagious) through coughing, sneezing, or having close contact. Take your medicines, including antibiotics, as told by your health care provider. Do not stop taking the antibiotic even if you start to feel better. To prevent the spread of germs, wash your hands well with soap and water. Have others do the same. Do not share food, drinking cups, or personal items. Get help right away if you have new symptoms, such as vomiting, severe headache, stiff or painful neck, chest pain, or shortness of breath. This information is not intended to replace advice given to you by your health care provider. Make sure you discuss any questions you have with your health care provider. Document Revised: 10/30/2020 Document Reviewed: 10/30/2020 Elsevier Patient Education   2023 [employer].

## 2021-12-18 NOTE — Progress Notes (Signed)
Subjective  CC:  Chief Complaint  Patient presents with   Sore Throat    Pt is here today c/o sore throat  and a headache. COVID test done this morning and it was negative.   Same day acute visit; PCP not available. New pt to me. Chart reviewed.   HPI: Charles David is a 46 y.o. male who presents to the office today to address the problems listed above in the chief complaint. C/o severe sore throat, mild URI sxs without fever. No SOB or GI sxs. No known exposure ot strep or mono however daughter and wife have had ST. OTC analgesics have been used with minimal or mild relief. Eating and drinking OK.   I reviewed the patients updated PMH, FH, and SocHx.    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    Allergies: Patient has No Known Allergies.  Review of Systems: Constitutional: Negative for fever malaise or anorexia Cardiovascular: negative for chest pain Respiratory: negative for SOB or persistent cough Gastrointestinal: negative for abdominal pain  Objective  Vitals: BP 122/70   Pulse 88   Temp 97.8 F (36.6 C)   Ht 6\' 1"  (1.854 m)   Wt 250 lb 9.6 oz (113.7 kg)   SpO2 96%   BMI 33.06 kg/m  General: no acute distress , A&Ox3 HEENT: PEERL, conjunctiva normal, bilateral EAC and TMs are normal. Nares normal. Oropharynx moist with erythematous posterior pharynx with exudate, + cervical LAD, 2+ tonsils, midline uvula, neck is  supple Cardiovascular:  RRR without murmur or gallop.  Respiratory:  Good breath sounds bilaterally, CTAB with normal respiratory effort Skin:  Warm, no rashes  Office Visit on 12/18/2021  Component Date Value Ref Range Status   Rapid Strep A Screen 12/18/2021 Positive (A)  Negative Final    Assessment  1. Strep pharyngitis      Plan  Supportive care with advil, tylenol, gargles etc discussed. PCN VK 500 bid x 10 days. RTO if sxs persist or worsen.   Follow up: prn   Commons side effects, risks, benefits, and alternatives for medications and treatment plan prescribed today were discussed, and the patient expressed understanding of the given instructions. Patient is instructed to call or message via MyChart if he/she has any questions or concerns regarding our treatment plan. No barriers to understanding were identified. We discussed Red Flag symptoms and signs in detail. Patient expressed understanding regarding what to do in case of urgent or emergency type symptoms.  Medication list was reconciled, printed and provided to the patient in AVS. Patient instructions and summary information was reviewed with the patient as documented in the AVS. This note was prepared with assistance of Dragon voice recognition software. Occasional wrong-word or sound-a-like substitutions may have occurred due to the inherent limitations of voice recognition software  Orders Placed This Encounter  Procedures   POCT rapid strep A   Meds ordered this encounter  Medications  penicillin v potassium (VEETID) 500 MG tablet    Sig: Take 1 tablet (500 mg total) by mouth 2 (two) times daily for 10 days.    Dispense:  20 tablet    Refill:  0

## 2022-04-14 ENCOUNTER — Encounter: Payer: Self-pay | Admitting: *Deleted

## 2022-05-26 ENCOUNTER — Encounter: Payer: 59 | Admitting: Family Medicine

## 2022-06-02 ENCOUNTER — Encounter: Payer: Self-pay | Admitting: Family Medicine

## 2022-06-02 ENCOUNTER — Ambulatory Visit (INDEPENDENT_AMBULATORY_CARE_PROVIDER_SITE_OTHER): Payer: 59 | Admitting: Family Medicine

## 2022-06-02 VITALS — BP 110/78 | HR 72 | Temp 97.1°F | Ht 73.0 in | Wt 259.8 lb

## 2022-06-02 DIAGNOSIS — R739 Hyperglycemia, unspecified: Secondary | ICD-10-CM

## 2022-06-02 DIAGNOSIS — Z9884 Bariatric surgery status: Secondary | ICD-10-CM | POA: Diagnosis not present

## 2022-06-02 DIAGNOSIS — Z0000 Encounter for general adult medical examination without abnormal findings: Secondary | ICD-10-CM | POA: Diagnosis not present

## 2022-06-02 DIAGNOSIS — K760 Fatty (change of) liver, not elsewhere classified: Secondary | ICD-10-CM

## 2022-06-02 DIAGNOSIS — E785 Hyperlipidemia, unspecified: Secondary | ICD-10-CM

## 2022-06-02 LAB — LIPID PANEL
Cholesterol: 169 mg/dL (ref 0–200)
HDL: 43.9 mg/dL (ref >39.00)
LDL Cholesterol: 106 mg/dL — ABNORMAL HIGH (ref 0–99)
NonHDL: 125.55
Total CHOL/HDL Ratio: 4
Triglycerides: 96 mg/dL (ref 0.0–149.0)
VLDL: 19.2 mg/dL (ref 0.0–40.0)

## 2022-06-02 LAB — VITAMIN D 25 HYDROXY (VIT D DEFICIENCY, FRACTURES): VITD: 34.61 ng/mL (ref 30.00–100.00)

## 2022-06-02 LAB — CBC WITH DIFFERENTIAL/PLATELET
Basophils Absolute: 0 10*3/uL (ref 0.0–0.1)
Basophils Relative: 0.7 % (ref 0.0–3.0)
Eosinophils Absolute: 0.1 10*3/uL (ref 0.0–0.7)
Eosinophils Relative: 1.6 % (ref 0.0–5.0)
HCT: 43 % (ref 39.0–52.0)
Hemoglobin: 14.7 g/dL (ref 13.0–17.0)
Lymphocytes Relative: 35.9 % (ref 12.0–46.0)
Lymphs Abs: 2.3 10*3/uL (ref 0.7–4.0)
MCHC: 34.1 g/dL (ref 30.0–36.0)
MCV: 88.2 fl (ref 78.0–100.0)
Monocytes Absolute: 0.4 10*3/uL (ref 0.1–1.0)
Monocytes Relative: 6.4 % (ref 3.0–12.0)
Neutro Abs: 3.5 10*3/uL (ref 1.4–7.7)
Neutrophils Relative %: 55.4 % (ref 43.0–77.0)
Platelets: 168 10*3/uL (ref 150.0–400.0)
RBC: 4.87 Mil/uL (ref 4.22–5.81)
RDW: 12.9 % (ref 11.5–15.5)
WBC: 6.3 10*3/uL (ref 4.0–10.5)

## 2022-06-02 LAB — HEMOGLOBIN A1C: Hgb A1c MFr Bld: 5.4 % (ref 4.6–6.5)

## 2022-06-02 LAB — COMPREHENSIVE METABOLIC PANEL
ALT: 15 U/L (ref 0–53)
AST: 14 U/L (ref 0–37)
Albumin: 4.7 g/dL (ref 3.5–5.2)
Alkaline Phosphatase: 49 U/L (ref 39–117)
BUN: 16 mg/dL (ref 6–23)
CO2: 32 mEq/L (ref 19–32)
Calcium: 9.7 mg/dL (ref 8.4–10.5)
Chloride: 105 mEq/L (ref 96–112)
Creatinine, Ser: 0.75 mg/dL (ref 0.40–1.50)
GFR: 107.07 mL/min (ref >60.00)
Glucose, Bld: 89 mg/dL (ref 70–99)
Potassium: 4.6 mEq/L (ref 3.5–5.1)
Sodium: 143 mEq/L (ref 135–145)
Total Bilirubin: 0.8 mg/dL (ref 0.2–1.2)
Total Protein: 7.1 g/dL (ref 6.0–8.3)

## 2022-06-02 LAB — VITAMIN B12: Vitamin B-12: 541 pg/mL (ref 211–911)

## 2022-06-02 NOTE — Patient Instructions (Addendum)
Sign release of information at the check out desk for  Colonoscopy from Dr. Loreta Ave or try to send to [place] through mychart a pdf or picture of results  Please stop by lab before you go If you have mychart- we will send your results within 3 business days of [place] receiving them.  If you do not have mychart- we will call you about results within 5 business days of [place] receiving them.  *please also note that you will see labs on mychart as soon as they post. I will later go in and write notes on them- will say "notes from Dr. Durene Cal"   Recommended follow up: Return in about 1 year (around 06/03/2023) for physical or sooner if needed.Schedule b4 you leave.

## 2022-06-02 NOTE — Progress Notes (Signed)
Phone: [phone]    Subjective:  Patient presents today for their annual physical. Chief complaint-noted.   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    Priority: Medium    Hyperglycemia 05/24/2015    Priority: Medium    Fatty liver     Priority: Medium    OSA (obstructive sleep apnea) 01/01/2015    Priority: Medium    Nausea without vomiting 05/24/2015    Priority: Low   GERD (gastroesophageal reflux disease) 05/24/2015    Priority: Low   Family history of colon cancer 05/24/2015    Priority: Low   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 10/11/2019   DOE (dyspnea on exertion) 12/02/2015   Past Surgical History:  Procedure Laterality Date   broken nose     ct guided biopsy     cystic lesion- nondiagnostic but followed radiographically   LAPAROSCOPIC GASTRIC SLEEVE RESECTION N/A 10/11/2019   Procedure: LAPAROSCOPIC GASTRIC SLEEVE RESECTION WITH HIATAL HERNIA REPAIR, Upper Endo, ERAS Pathway;  Surgeon: Johnathan Hausen, MD;  Location: [facility];  Service: General;  Laterality: N/A;    Family History  Problem Relation Age of Onset   Diabetes Mother        10s   Hypertension Mother    CVA Mother        50   Colon cancer Father        mid 44s   Liver cancer Father        decesed- age 31. NASH etiology   Diabetes Father        57s   Hypertension Father     Medications- reviewed and updated Current Outpatient Medications  Medication Sig Dispense Refill   Multiple Vitamin (MULTIVITAMIN) capsule Take 1 capsule by mouth daily.     No current  facility-administered medications for this visit.    Allergies-reviewed and updated No Known Allergies  Social History   Social History Narrative   Married. 3 children 6.5 Iylah (sounds like isla)  and 30.48 years old [place] and 3 [place] in 2020.    Wife Magda is my patient.       Works as Psychologist, educational at [employer]   [place]- for med school   Residency IM- in [place]: movies, time on couch   Working on playing soccer         Objective:  BP 110/78   Pulse 72   Temp (!) 97.1 F (36.2 C)   Ht _0  (1.854 m)   Wt 259 lb 12.8 oz (117.8 kg)   SpO2 98%   BMI 34.28 kg/m  Gen: NAD, resting comfortably HEENT: Mucous membranes are moist. Oropharynx normal Neck: no thyromegaly CV: RRR no murmurs rubs or gallops Lungs: CTAB no crackles, wheeze, rhonchi Abdomen: soft/nontender/nondistended/normal bowel sounds. No rebound or guarding.  Ext: no edema Skin: warm, dry Neuro: grossly normal, moves all extremities, PERRLA     Assessment and Plan:  46 y.o. male presenting for annual physical.  Health Maintenance counseling: 1. Anticipatory guidance: Patient counseled regarding  regular dental exams -q6 months, eye exams - no vision issues,  avoiding smoking and second hand smoke , limiting alcohol to 2 beverages per day-none due to his faith, no illicit drugs.   2. Risk factor reduction:  Advised patient of need for regular exercise and diet rich and fruits and vegetables to reduce risk of heart attack and stroke.  Exercise-last year was trying to get into swimming- still trying to find the balance in his schedule to add this back in .  Diet/weight management-prior significant weight loss after gastric sleeve-mild weight gain in last year up 16 pounds-may start going to [place] for ongoing counseling with obesity- has a friend joining them. Goal at this point is for stability Wt Readings from Last 3 Encounters:  06/02/22 259 lb 12.8 oz (117.8 kg)  12/18/21 250 lb 9.6 oz  (113.7 kg)  05/21/21 243 lb 4 oz (110.3 kg)  3. Immunizations/screenings/ancillary studies-already had flu shot with work, declines COVID vaccination, has wanted to hold off on Prevnar for fatty liver Immunization History  Administered Date(s) Administered   Influenza Nasal 04/15/2022   Influenza,inj,Quad PF,6+ Mos 05/22/2019   Influenza-Unspecified 04/30/2015, 05/12/2020, 04/15/2021   PFIZER(Purple Top)SARS-COV-2 Vaccination 07/08/2019, 07/26/2019, 03/29/2020   Tdap 05/23/2012, 10/23/2020   4. Prostate cancer screening- No family history, start at age 89 . No change in urinary symptoms.     5. Colon cancer screening -  Father with colon cancer in 16s. He had colonoscopy 09/15/2014 and has been recalled-had colonoscopy in 2022-we are still trying to get records from Dr. Collene Mares. 5 year repeat 6. Skin cancer screening/prevention- no dermatologist. advised regular sunscreen use. Denies worrisome, changing, or new skin lesions.  7. Testicular cancer screening- advised monthly self exams   8. STD screening- patient opts monogamous  9. Smoking associated screening- Never smoker  Status of chronic or acute concerns   #social update- took obesity boards- finds out in December- planned transition to outpatient after that comes back. 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   #Lytic bone lesion/bone cyst noted at right superior iliac spine (asymptomatic and stable from 20 19-20 22)-saw Dr. Leonides Schanz of orthopedic surgery with [facility] most recently 08/12/2021-no further follow-up was indicated  #Sleeve gastrectomy 09/2019--discussed bariatric labs- opts in to ones ordered   # Hyperglycemia/insulin resistance/prediabetes S:  Medication: none Lab Results  Component Value Date   HGBA1C 5.0 05/21/2021   HGBA1C 5.0 04/24/2020   HGBA1C 5.8 01/04/2019   A/P: significant improvement after sleeve gastrectomy- update a1c again with mild weight gain   #hyperlipidemia-CT calcium score  of 0 on 11/06/2020 S: Medication: none  Lab Results  Component Value Date   CHOL 176 05/21/2021   HDL 46.20 05/21/2021   LDLCALC 119 (H) 05/21/2021   TRIG 55.0 05/21/2021   CHOLHDL 4 05/21/2021   A/P: unlikely to start statin- focus on lifestyle with reassuring ct calcium   #GERD-resolved with sleeve gastrectomy  #OSA-has been off CPAP with weight loss   #Fatty liver-assume improved with weight loss-update LFTs     Latest Ref Rng & Units 05/21/2021   10:58 AM 04/24/2020    2:33 PM 10/11/2019    6:15 AM  Hepatic Function  Total Protein 6.0 - 8.3 g/dL 6.9  7.1  7.1   Albumin 3.5 - 5.2 g/dL 4.8   4.2   AST 0 - 37 U/L 15  15  44   ALT 0 - 53 U/L 17  19  48  Alk Phosphatase 39 - 117 U/L 56   67   Total Bilirubin 0.2 - 1.2 mg/dL 0.8  0.7  1.1     Recommended follow up: Return in about 1 year (around 06/03/2023) for physical or sooner if needed.Schedule b4 you leave.  Lab/Order associations:fasting- coffee only    ICD-10-CM   1. Preventative health care  Z00.00 CBC with Differential/Platelet    Comprehensive metabolic panel    Lipid panel    HgB A1c    2. Hyperglycemia  R73.9 HgB A1c    3. Fatty liver  K76.0 Comprehensive metabolic panel    4. Bariatric surgery status  Z98.84 Vitamin B12    Vitamin B1    VITAMIN D 25 Hydroxy (Vit-D Deficiency, Fractures)    PTH, intact and calcium    5. Mild hyperlipidemia  E78.5 CBC with Differential/Platelet    Comprehensive metabolic panel    Lipid panel     No orders of the defined types were placed in this encounter.  Return precautions advised.   Garret Reddish, MD

## 2022-06-06 LAB — VITAMIN B1: Vitamin B1 (Thiamine): 9 nmol/L (ref 8–30)

## 2022-06-06 LAB — PTH, INTACT AND CALCIUM
Calcium: 10.1 mg/dL (ref 8.6–10.3)
PTH: 17 pg/mL (ref 16–77)

## 2022-08-11 ENCOUNTER — Other Ambulatory Visit (HOSPITAL_COMMUNITY): Payer: Self-pay

## 2022-08-11 DIAGNOSIS — Z1331 Encounter for screening for depression: Secondary | ICD-10-CM | POA: Diagnosis not present

## 2022-08-11 DIAGNOSIS — G4733 Obstructive sleep apnea (adult) (pediatric): Secondary | ICD-10-CM | POA: Diagnosis not present

## 2022-08-11 DIAGNOSIS — K219 Gastro-esophageal reflux disease without esophagitis: Secondary | ICD-10-CM | POA: Diagnosis not present

## 2022-08-11 DIAGNOSIS — R5383 Other fatigue: Secondary | ICD-10-CM | POA: Diagnosis not present

## 2022-08-11 DIAGNOSIS — Z6834 Body mass index (BMI) 34.0-34.9, adult: Secondary | ICD-10-CM | POA: Diagnosis not present

## 2022-08-11 DIAGNOSIS — F5081 Binge eating disorder: Secondary | ICD-10-CM | POA: Diagnosis not present

## 2022-08-11 DIAGNOSIS — E6609 Other obesity due to excess calories: Secondary | ICD-10-CM | POA: Diagnosis not present

## 2022-08-11 MED ORDER — ZEPBOUND 2.5 MG/0.5ML ~~LOC~~ SOAJ
2.5000 mg | SUBCUTANEOUS | 0 refills | Status: DC
  Filled 2022-08-11 – 2022-08-13 (×2): qty 2, 28d supply, fill #0

## 2022-08-13 ENCOUNTER — Other Ambulatory Visit (HOSPITAL_COMMUNITY): Payer: Self-pay

## 2022-08-25 ENCOUNTER — Other Ambulatory Visit (HOSPITAL_COMMUNITY): Payer: Self-pay

## 2022-08-25 DIAGNOSIS — Z6835 Body mass index (BMI) 35.0-35.9, adult: Secondary | ICD-10-CM | POA: Diagnosis not present

## 2022-08-25 DIAGNOSIS — F5081 Binge eating disorder: Secondary | ICD-10-CM | POA: Diagnosis not present

## 2022-08-25 DIAGNOSIS — K219 Gastro-esophageal reflux disease without esophagitis: Secondary | ICD-10-CM | POA: Diagnosis not present

## 2022-08-25 DIAGNOSIS — E6609 Other obesity due to excess calories: Secondary | ICD-10-CM | POA: Diagnosis not present

## 2022-08-25 DIAGNOSIS — G4733 Obstructive sleep apnea (adult) (pediatric): Secondary | ICD-10-CM | POA: Diagnosis not present

## 2022-08-25 MED ORDER — TIRZEPATIDE-WEIGHT MANAGEMENT 7.5 MG/0.5ML ~~LOC~~ SOAJ
7.5000 mg | SUBCUTANEOUS | 0 refills | Status: DC
  Filled 2022-08-25: qty 2, 28d supply, fill #0

## 2022-09-01 ENCOUNTER — Other Ambulatory Visit (HOSPITAL_COMMUNITY): Payer: Self-pay

## 2022-09-08 DIAGNOSIS — Z6835 Body mass index (BMI) 35.0-35.9, adult: Secondary | ICD-10-CM | POA: Diagnosis not present

## 2022-09-08 DIAGNOSIS — K219 Gastro-esophageal reflux disease without esophagitis: Secondary | ICD-10-CM | POA: Diagnosis not present

## 2022-09-08 DIAGNOSIS — G4733 Obstructive sleep apnea (adult) (pediatric): Secondary | ICD-10-CM | POA: Diagnosis not present

## 2022-09-08 DIAGNOSIS — F5081 Binge eating disorder: Secondary | ICD-10-CM | POA: Diagnosis not present

## 2022-09-08 DIAGNOSIS — E6609 Other obesity due to excess calories: Secondary | ICD-10-CM | POA: Diagnosis not present

## 2022-09-27 ENCOUNTER — Other Ambulatory Visit (HOSPITAL_COMMUNITY): Payer: Self-pay

## 2022-09-27 MED ORDER — ZEPBOUND 12.5 MG/0.5ML ~~LOC~~ SOAJ
12.5000 mg | SUBCUTANEOUS | 0 refills | Status: DC
  Filled 2022-09-27 – 2022-10-13 (×2): qty 2, 28d supply, fill #0

## 2022-09-29 ENCOUNTER — Other Ambulatory Visit (HOSPITAL_COMMUNITY): Payer: Self-pay

## 2022-10-13 ENCOUNTER — Other Ambulatory Visit (HOSPITAL_COMMUNITY): Payer: Self-pay

## 2022-10-14 ENCOUNTER — Other Ambulatory Visit (HOSPITAL_COMMUNITY): Payer: Self-pay

## 2022-10-16 ENCOUNTER — Other Ambulatory Visit (HOSPITAL_COMMUNITY): Payer: Self-pay

## 2022-10-30 ENCOUNTER — Other Ambulatory Visit (HOSPITAL_COMMUNITY): Payer: Self-pay

## 2022-10-30 DIAGNOSIS — K219 Gastro-esophageal reflux disease without esophagitis: Secondary | ICD-10-CM | POA: Diagnosis not present

## 2022-10-30 DIAGNOSIS — G4733 Obstructive sleep apnea (adult) (pediatric): Secondary | ICD-10-CM | POA: Diagnosis not present

## 2022-10-30 DIAGNOSIS — E6609 Other obesity due to excess calories: Secondary | ICD-10-CM | POA: Diagnosis not present

## 2022-10-30 DIAGNOSIS — Z6835 Body mass index (BMI) 35.0-35.9, adult: Secondary | ICD-10-CM | POA: Diagnosis not present

## 2022-10-30 DIAGNOSIS — F5081 Binge eating disorder: Secondary | ICD-10-CM | POA: Diagnosis not present

## 2022-10-30 MED ORDER — ZEPBOUND 15 MG/0.5ML ~~LOC~~ SOAJ
15.0000 mg | SUBCUTANEOUS | 2 refills | Status: DC
  Filled 2022-10-30 – 2022-11-07 (×2): qty 2, 28d supply, fill #0
  Filled 2022-12-05: qty 2, 28d supply, fill #1

## 2022-11-07 ENCOUNTER — Other Ambulatory Visit (HOSPITAL_COMMUNITY): Payer: Self-pay

## 2022-11-11 ENCOUNTER — Other Ambulatory Visit (HOSPITAL_COMMUNITY): Payer: Self-pay

## 2022-11-11 MED ORDER — AMOXICILLIN-POT CLAVULANATE 875-125 MG PO TABS
1.0000 | ORAL_TABLET | Freq: Two times a day (BID) | ORAL | 1 refills | Status: DC
  Filled 2022-11-11: qty 20, 10d supply, fill #0
  Filled 2022-11-18 – 2022-11-19 (×2): qty 20, 10d supply, fill #1

## 2022-11-18 ENCOUNTER — Other Ambulatory Visit (HOSPITAL_COMMUNITY): Payer: Self-pay

## 2022-12-01 DIAGNOSIS — F5081 Binge eating disorder: Secondary | ICD-10-CM | POA: Diagnosis not present

## 2022-12-01 DIAGNOSIS — E6609 Other obesity due to excess calories: Secondary | ICD-10-CM | POA: Diagnosis not present

## 2022-12-01 DIAGNOSIS — G4733 Obstructive sleep apnea (adult) (pediatric): Secondary | ICD-10-CM | POA: Diagnosis not present

## 2022-12-01 DIAGNOSIS — Z6835 Body mass index (BMI) 35.0-35.9, adult: Secondary | ICD-10-CM | POA: Diagnosis not present

## 2022-12-01 DIAGNOSIS — K219 Gastro-esophageal reflux disease without esophagitis: Secondary | ICD-10-CM | POA: Diagnosis not present

## 2022-12-02 ENCOUNTER — Other Ambulatory Visit (HOSPITAL_COMMUNITY): Payer: Self-pay

## 2022-12-02 MED ORDER — ZEPBOUND 15 MG/0.5ML ~~LOC~~ SOAJ
15.0000 mg | SUBCUTANEOUS | 2 refills | Status: DC
  Filled 2022-12-02 – 2022-12-05 (×2): qty 2, 28d supply, fill #0

## 2022-12-05 ENCOUNTER — Other Ambulatory Visit (HOSPITAL_COMMUNITY): Payer: Self-pay

## 2022-12-12 DIAGNOSIS — R102 Pelvic and perineal pain: Secondary | ICD-10-CM | POA: Diagnosis not present

## 2022-12-12 DIAGNOSIS — M9907 Segmental and somatic dysfunction of upper extremity: Secondary | ICD-10-CM | POA: Diagnosis not present

## 2022-12-12 DIAGNOSIS — M9901 Segmental and somatic dysfunction of cervical region: Secondary | ICD-10-CM | POA: Diagnosis not present

## 2022-12-12 DIAGNOSIS — M542 Cervicalgia: Secondary | ICD-10-CM | POA: Diagnosis not present

## 2022-12-12 DIAGNOSIS — M9902 Segmental and somatic dysfunction of thoracic region: Secondary | ICD-10-CM | POA: Diagnosis not present

## 2022-12-12 DIAGNOSIS — M9908 Segmental and somatic dysfunction of rib cage: Secondary | ICD-10-CM | POA: Diagnosis not present

## 2022-12-19 ENCOUNTER — Other Ambulatory Visit (HOSPITAL_COMMUNITY): Payer: Self-pay

## 2022-12-25 DIAGNOSIS — Z903 Acquired absence of stomach [part of]: Secondary | ICD-10-CM | POA: Diagnosis not present

## 2022-12-25 DIAGNOSIS — Z6832 Body mass index (BMI) 32.0-32.9, adult: Secondary | ICD-10-CM | POA: Diagnosis not present

## 2022-12-25 DIAGNOSIS — G4733 Obstructive sleep apnea (adult) (pediatric): Secondary | ICD-10-CM | POA: Diagnosis not present

## 2022-12-25 DIAGNOSIS — K219 Gastro-esophageal reflux disease without esophagitis: Secondary | ICD-10-CM | POA: Diagnosis not present

## 2022-12-25 DIAGNOSIS — F5081 Binge eating disorder: Secondary | ICD-10-CM | POA: Diagnosis not present

## 2022-12-25 DIAGNOSIS — E6609 Other obesity due to excess calories: Secondary | ICD-10-CM | POA: Diagnosis not present

## 2022-12-26 ENCOUNTER — Other Ambulatory Visit (HOSPITAL_COMMUNITY): Payer: Self-pay

## 2022-12-26 MED ORDER — ZEPBOUND 15 MG/0.5ML ~~LOC~~ SOAJ
15.0000 mg | SUBCUTANEOUS | 2 refills | Status: DC
  Filled 2022-12-26: qty 2, 28d supply, fill #0

## 2022-12-30 ENCOUNTER — Other Ambulatory Visit (HOSPITAL_COMMUNITY): Payer: Self-pay

## 2023-02-18 ENCOUNTER — Other Ambulatory Visit (HOSPITAL_COMMUNITY): Payer: Self-pay

## 2023-02-18 DIAGNOSIS — Z6832 Body mass index (BMI) 32.0-32.9, adult: Secondary | ICD-10-CM | POA: Diagnosis not present

## 2023-02-18 DIAGNOSIS — G4733 Obstructive sleep apnea (adult) (pediatric): Secondary | ICD-10-CM | POA: Diagnosis not present

## 2023-02-18 DIAGNOSIS — E6609 Other obesity due to excess calories: Secondary | ICD-10-CM | POA: Diagnosis not present

## 2023-02-18 DIAGNOSIS — Z9189 Other specified personal risk factors, not elsewhere classified: Secondary | ICD-10-CM | POA: Diagnosis not present

## 2023-02-18 DIAGNOSIS — H0015 Chalazion left lower eyelid: Secondary | ICD-10-CM | POA: Diagnosis not present

## 2023-02-18 DIAGNOSIS — K219 Gastro-esophageal reflux disease without esophagitis: Secondary | ICD-10-CM | POA: Diagnosis not present

## 2023-02-18 DIAGNOSIS — Z903 Acquired absence of stomach [part of]: Secondary | ICD-10-CM | POA: Diagnosis not present

## 2023-02-18 DIAGNOSIS — F5081 Binge eating disorder: Secondary | ICD-10-CM | POA: Diagnosis not present

## 2023-02-18 MED ORDER — ZEPBOUND 15 MG/0.5ML ~~LOC~~ SOAJ
15.0000 mg | SUBCUTANEOUS | 2 refills | Status: DC
  Filled 2023-02-18 – 2023-03-26 (×2): qty 2, 28d supply, fill #0

## 2023-02-20 DIAGNOSIS — H469 Unspecified optic neuritis: Secondary | ICD-10-CM | POA: Diagnosis not present

## 2023-02-27 ENCOUNTER — Other Ambulatory Visit: Payer: Self-pay | Admitting: Ophthalmology

## 2023-02-27 DIAGNOSIS — H469 Unspecified optic neuritis: Secondary | ICD-10-CM

## 2023-03-03 ENCOUNTER — Other Ambulatory Visit (HOSPITAL_COMMUNITY): Payer: Self-pay

## 2023-03-18 ENCOUNTER — Ambulatory Visit
Admission: RE | Admit: 2023-03-18 | Discharge: 2023-03-18 | Disposition: A | Discharge status: Home / Self Care | Payer: 59 | Source: Ambulatory Visit | Attending: Ophthalmology | Admitting: Ophthalmology

## 2023-03-18 DIAGNOSIS — H469 Unspecified optic neuritis: Secondary | ICD-10-CM

## 2023-03-18 MED ORDER — GADOPICLENOL 0.5 MMOL/ML IV SOLN
10.0000 mL | Freq: Once | INTRAVENOUS | Status: AC | PRN
  Administered 2023-03-18: 10 mL via INTRAVENOUS

## 2023-03-24 DIAGNOSIS — F5081 Binge eating disorder: Secondary | ICD-10-CM | POA: Diagnosis not present

## 2023-03-24 DIAGNOSIS — Z6832 Body mass index (BMI) 32.0-32.9, adult: Secondary | ICD-10-CM | POA: Diagnosis not present

## 2023-03-24 DIAGNOSIS — G4733 Obstructive sleep apnea (adult) (pediatric): Secondary | ICD-10-CM | POA: Diagnosis not present

## 2023-03-24 DIAGNOSIS — K219 Gastro-esophageal reflux disease without esophagitis: Secondary | ICD-10-CM | POA: Diagnosis not present

## 2023-03-24 DIAGNOSIS — E6609 Other obesity due to excess calories: Secondary | ICD-10-CM | POA: Diagnosis not present

## 2023-03-24 DIAGNOSIS — Z903 Acquired absence of stomach [part of]: Secondary | ICD-10-CM | POA: Diagnosis not present

## 2023-03-24 DIAGNOSIS — Z9189 Other specified personal risk factors, not elsewhere classified: Secondary | ICD-10-CM | POA: Diagnosis not present

## 2023-03-26 ENCOUNTER — Other Ambulatory Visit (HOSPITAL_COMMUNITY): Payer: Self-pay

## 2023-04-15 ENCOUNTER — Other Ambulatory Visit: Payer: Self-pay | Admitting: Pulmonary Disease

## 2023-04-15 DIAGNOSIS — G4733 Obstructive sleep apnea (adult) (pediatric): Secondary | ICD-10-CM

## 2023-04-15 NOTE — Progress Notes (Signed)
Schedule face to face office visit

## 2023-04-16 ENCOUNTER — Telehealth: Payer: Self-pay | Admitting: Pulmonary Disease

## 2023-04-16 NOTE — Telephone Encounter (Signed)
Kindly call patient to have him scheduled for office visit tomorrow  Can be double booked  He needs a sleep study, coming in to pick up machine tomorrow

## 2023-04-16 NOTE — Telephone Encounter (Signed)
Dr. Val Eagle- He would like to come in tomorrow, Friday, at 10:30 due to his sched today. Is that OK? I have asked Fredric Mare to double book at that time and day  and I can call him if an appt today was a must. Thanks.-T

## 2023-04-17 ENCOUNTER — Ambulatory Visit: Payer: 59

## 2023-04-17 ENCOUNTER — Ambulatory Visit (INDEPENDENT_AMBULATORY_CARE_PROVIDER_SITE_OTHER): Payer: 59 | Admitting: Pulmonary Disease

## 2023-04-17 ENCOUNTER — Encounter: Payer: Self-pay | Admitting: Pulmonary Disease

## 2023-04-17 VITALS — BP 104/74 | HR 74 | Temp 97.1°F | Ht 73.0 in | Wt 252.1 lb

## 2023-04-17 DIAGNOSIS — G4733 Obstructive sleep apnea (adult) (pediatric): Secondary | ICD-10-CM

## 2023-04-17 DIAGNOSIS — G4710 Hypersomnia, unspecified: Secondary | ICD-10-CM | POA: Diagnosis not present

## 2023-04-17 NOTE — Progress Notes (Signed)
Charles David    [id number]    Apr 09, 1976  Primary Care Physician:Hunter, Aldine Contes, MD  Referring Physician: Shelva Majestic, MD [address] [place],  [place] [address]  Chief complaint:   Patient with past history of obstructive sleep apnea  HPI:  Past history of obstructive sleep apnea, was on CPAP therapy Has lost a lot of weight recently  Was told about optic neuropathy relating to ischemia which may be as a result of having sleep apnea.  He has gone back to trying to use his machine but it is uncomfortable  Usually goes to bed between 11 and 5 AM About 1-2 awakenings Admits to dryness of his mouth in the morning Occasional headache Occasional night sweats  No family history of obstructive sleep apnea No hypertension  Non-smoker  Outpatient Encounter Medications as of 04/17/2023  Medication Sig   Multiple Vitamin (MULTIVITAMIN) capsule Take 1 capsule by mouth daily.   [DISCONTINUED] amoxicillin-clavulanate (AUGMENTIN) 875-125 MG tablet Take 1 tablet by mouth 2 (two) times daily for 10 days (Patient not taking: Reported on 04/17/2023)   [DISCONTINUED] tirzepatide (ZEPBOUND) 12.5 MG/0.5ML Pen Inject 12.5 mg into the skin once a week. (Patient not taking: Reported on 04/17/2023)   [DISCONTINUED] tirzepatide (ZEPBOUND) 15 MG/0.5ML Pen Inject 15 mg into the skin once a week. (Patient not taking: Reported on 04/17/2023)   [DISCONTINUED] tirzepatide (ZEPBOUND) 15 MG/0.5ML Pen Inject 15 mg into the skin once a week. (Patient not taking: Reported on 04/17/2023)   [DISCONTINUED] tirzepatide (ZEPBOUND) 15 MG/0.5ML Pen Inject 15 mg into the skin once a week. (Patient not taking: Reported on 04/17/2023)   [DISCONTINUED] tirzepatide (ZEPBOUND) 15 MG/0.5ML Pen Inject 15 mg into the skin once a week. (Patient not taking: Reported on 04/17/2023)   [DISCONTINUED] tirzepatide (ZEPBOUND) 2.5 MG/0.5ML Pen Inject 2.5 mg into the skin once a week. (Patient not taking:  Reported on 04/17/2023)   [DISCONTINUED] tirzepatide (ZEPBOUND) 7.5 MG/0.5ML Pen Inject 7.5 mg into the skin once a week. (Patient not taking: Reported on 04/17/2023)   No facility-administered encounter medications on file as of 04/17/2023.    Allergies as of 04/17/2023   (No Known Allergies)    Past Medical History:  Diagnosis Date   Fatty liver    GERD (gastroesophageal reflux disease)    Hiatal hernia    OSA (obstructive sleep apnea)     Past Surgical History:  Procedure Laterality Date   broken nose     ct guided biopsy     cystic lesion- nondiagnostic but followed radiographically   LAPAROSCOPIC GASTRIC SLEEVE RESECTION N/A 10/11/2019   Procedure: LAPAROSCOPIC GASTRIC SLEEVE RESECTION WITH HIATAL HERNIA REPAIR, Upper Endo, ERAS Pathway;  Surgeon: Luretha Murphy, MD;  Location: [facility];  Service: General;  Laterality: N/A;    Family History  Problem Relation Age of Onset   Diabetes Mother        60s   Hypertension Mother    CVA Mother        28   Colon cancer Father        mid 50s   Liver cancer Father        decesed- age 16. NASH etiology   Diabetes Father        107s   Hypertension Father     Social History   Socioeconomic History   Marital status: Married    Spouse name: Not on file   Number of children: Not on file  Years of education: Not on file   Highest education level: Not on file  Occupational History   Occupation: physician  Tobacco Use   Smoking status: Never   Smokeless tobacco: Never  Vaping Use   Vaping status: Never Used  Substance and Sexual Activity   Alcohol use: No    Alcohol/week: 0.0 standard drinks of alcohol   Drug use: No   Sexual activity: Not on file  Other Topics Concern   Not on file  Social History Narrative   Married. 3 children 6.5 Iylah (sounds like isla)  and 61.49 years old [place] and 3 Naya in 2020.    Wife Magda is my patient.       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    Residency IM- in [place]      Hobbies: movies, time on couch   Working on playing soccer      Social Determinants of Corporate investment banker Strain: Not on file  Food Insecurity: No Food Insecurity (08/12/2021)   Received from [facility], [facility]   Hunger Vital Sign    Worried About Running Out of Food in the Last Year: Never true    Ran Out of Food in the Last Year: Never true  Transportation Needs: Not on file  Physical Activity: Unknown (06/14/2021)   Received from [facility], [facility]   Exercise Vital Sign    Days of Exercise per Week: 1 day    Minutes of Exercise per Session: Not on file  Stress: Not on file  Social Connections: Unknown (12/02/2021)   Received from [facility]   Social Network    Social Network: Not on file  Intimate Partner Violence: Unknown (10/23/2021)   Received from [facility], [facility]   HITS    Physically Hurt: Not on file    Insult or Talk Down To: Not on file    Threaten Physical Harm: Not on file    Scream or Curse: Not on file    Review of Systems  Respiratory:  Positive for apnea. Negative for shortness of breath.   Psychiatric/Behavioral:  Positive for sleep disturbance.     Vitals:   04/17/23 1040  BP: 104/74  Pulse: 74  Temp: (!) 97.1 F (36.2 C)  SpO2: 98%     Physical Exam Constitutional:      Appearance: He is obese.  HENT:     Head: Normocephalic.  Eyes:     General: No scleral icterus. Cardiovascular:     Rate and Rhythm: Normal rate and regular rhythm.     Heart sounds: No murmur heard.    No friction rub.  Pulmonary:     Effort: No respiratory distress.     Breath sounds: No stridor. No wheezing or rhonchi.  Musculoskeletal:     Cervical back: No rigidity or tenderness.  Neurological:     Mental Status: He is alert.  Psychiatric:        Mood and Affect: Mood normal.    Epworth Sleepiness Scale of 9  Data Reviewed: Previous study was in January 2016  Assessment:   History of obstructive sleep apnea  Ischemic optic neuropathy  Daytime sleepiness  Plan/Recommendations: Schedule for home sleep study  Will update with results as soon as reviewed  Expectation is that severity of his sleep disordered breathing will be much less with his significant weight loss recently  Tentative follow-up in about 3  months    Virl Diamond MD [facility] 04/17/2023, 10:50 AM  CC: Shelva Majestic, MD

## 2023-04-17 NOTE — Patient Instructions (Signed)
Will update you with results of the home sleep test once reviewed  Continue using your current machine as tolerated  Continue weight loss efforts  Tentative follow-up in about 3 months

## 2023-04-20 ENCOUNTER — Telehealth: Payer: Self-pay | Admitting: Pulmonary Disease

## 2023-04-20 DIAGNOSIS — G4710 Hypersomnia, unspecified: Secondary | ICD-10-CM | POA: Diagnosis not present

## 2023-04-20 DIAGNOSIS — G4733 Obstructive sleep apnea (adult) (pediatric): Secondary | ICD-10-CM

## 2023-04-20 NOTE — Telephone Encounter (Signed)
Call patient  Sleep study result  Date of study: 04/19/2023  Impression: Mild obstructive sleep apnea  Recommendation: Options of treatment for mild obstructive sleep apnea will include  1.  CPAP therapy if there is significant daytime sleepiness or other comorbidities including history of CVA or cardiac disease  -If CPAP is chosen as an option of treatment auto titrating CPAP with a pressure setting of 5-15 will be appropriate  2.  Watchful waiting with emphasis on weight loss measures, sleep position modification to optimize lateral sleep, elevating the head of the bed by about 30 degrees may also help.  3.  An oral device may be fashioned for the treatment of mild sleep disordered breathing, will involve referral to dentist.  Follow-up as previously scheduled

## 2023-04-21 ENCOUNTER — Telehealth: Payer: Self-pay | Admitting: Pulmonary Disease

## 2023-04-21 DIAGNOSIS — H47012 Ischemic optic neuropathy, left eye: Secondary | ICD-10-CM | POA: Diagnosis not present

## 2023-04-21 NOTE — Telephone Encounter (Signed)
Order has been placed for CPAP.  Nothing else further needed.

## 2023-04-21 NOTE — Telephone Encounter (Signed)
Updated patient about sleep study result  Please send an order for auto CPAP settings of 5-20 with heated humidification with patient's mask of choice  May go to [facility]

## 2023-05-05 ENCOUNTER — Other Ambulatory Visit (HOSPITAL_COMMUNITY): Payer: Self-pay

## 2023-05-05 MED ORDER — LISDEXAMFETAMINE DIMESYLATE 20 MG PO CHEW
20.0000 mg | CHEWABLE_TABLET | Freq: Every morning | ORAL | 0 refills | Status: DC
Start: 2023-05-05 — End: 2023-08-17
  Filled 2023-05-05: qty 30, 30d supply, fill #0

## 2023-05-06 ENCOUNTER — Other Ambulatory Visit (HOSPITAL_COMMUNITY): Payer: Self-pay

## 2023-05-06 ENCOUNTER — Encounter (HOSPITAL_COMMUNITY): Payer: Self-pay | Admitting: *Deleted

## 2023-05-14 DIAGNOSIS — G4733 Obstructive sleep apnea (adult) (pediatric): Secondary | ICD-10-CM | POA: Diagnosis not present

## 2023-05-22 DIAGNOSIS — G4733 Obstructive sleep apnea (adult) (pediatric): Secondary | ICD-10-CM | POA: Diagnosis not present

## 2023-05-27 DIAGNOSIS — H469 Unspecified optic neuritis: Secondary | ICD-10-CM | POA: Diagnosis not present

## 2023-06-02 ENCOUNTER — Other Ambulatory Visit (HOSPITAL_COMMUNITY): Payer: Self-pay

## 2023-06-02 MED ORDER — ZEPBOUND 15 MG/0.5ML ~~LOC~~ SOAJ
15.0000 mg | SUBCUTANEOUS | 3 refills | Status: AC
  Filled 2023-06-02: qty 2, 28d supply, fill #0
  Filled 2023-07-06: qty 2, 28d supply, fill #1
  Filled 2023-08-18: qty 2, 28d supply, fill #2
  Filled 2023-10-23: qty 2, 28d supply, fill #3

## 2023-06-02 MED ORDER — LISDEXAMFETAMINE DIMESYLATE 40 MG PO CHEW
40.0000 mg | CHEWABLE_TABLET | Freq: Every morning | ORAL | 0 refills | Status: DC
Start: 2023-06-02 — End: 2023-07-06
  Filled 2023-06-02: qty 30, 30d supply, fill #0

## 2023-06-03 ENCOUNTER — Telehealth: Payer: Self-pay | Admitting: Pulmonary Disease

## 2023-06-03 DIAGNOSIS — G4733 Obstructive sleep apnea (adult) (pediatric): Secondary | ICD-10-CM

## 2023-06-03 NOTE — Telephone Encounter (Signed)
DME referral to change pressures  Decrease pressure from 5-20 to 5-15  [facility]

## 2023-06-05 NOTE — Telephone Encounter (Signed)
Order placed for setting change.

## 2023-06-09 ENCOUNTER — Encounter: Payer: Self-pay | Admitting: Family Medicine

## 2023-06-14 DIAGNOSIS — G4733 Obstructive sleep apnea (adult) (pediatric): Secondary | ICD-10-CM | POA: Diagnosis not present

## 2023-06-25 DIAGNOSIS — E66811 Obesity, class 1: Secondary | ICD-10-CM | POA: Diagnosis not present

## 2023-06-25 DIAGNOSIS — G4733 Obstructive sleep apnea (adult) (pediatric): Secondary | ICD-10-CM | POA: Diagnosis not present

## 2023-06-25 DIAGNOSIS — Z9884 Bariatric surgery status: Secondary | ICD-10-CM | POA: Diagnosis not present

## 2023-06-25 DIAGNOSIS — Z800 Family history of malignant neoplasm of digestive organs: Secondary | ICD-10-CM | POA: Diagnosis not present

## 2023-06-30 DIAGNOSIS — H47012 Ischemic optic neuropathy, left eye: Secondary | ICD-10-CM | POA: Diagnosis not present

## 2023-07-06 ENCOUNTER — Other Ambulatory Visit (HOSPITAL_COMMUNITY): Payer: Self-pay

## 2023-07-06 MED ORDER — LISDEXAMFETAMINE DIMESYLATE 40 MG PO CHEW
40.0000 mg | CHEWABLE_TABLET | Freq: Every day | ORAL | 0 refills | Status: DC
  Filled 2023-07-06: qty 30, 30d supply, fill #0

## 2023-07-09 ENCOUNTER — Other Ambulatory Visit (HOSPITAL_COMMUNITY): Payer: Self-pay

## 2023-07-09 MED ORDER — LISDEXAMFETAMINE DIMESYLATE 40 MG PO CHEW: 40.0000 mg | CHEWABLE_TABLET | Freq: Every morning | ORAL | 0 refills | Status: DC

## 2023-07-14 DIAGNOSIS — G4733 Obstructive sleep apnea (adult) (pediatric): Secondary | ICD-10-CM | POA: Diagnosis not present

## 2023-07-30 ENCOUNTER — Telehealth: Payer: Self-pay | Admitting: Pulmonary Disease

## 2023-07-30 NOTE — Telephone Encounter (Addendum)
 Please schedule for Video visit for OSA followup, CPAP compliance with me or APP  Needs visit within 90 days of starting CPAP, order was placed 04/21/2023  Need to have CPAP download pulled for the visit pls

## 2023-08-06 ENCOUNTER — Other Ambulatory Visit (HOSPITAL_COMMUNITY): Payer: Self-pay

## 2023-08-06 ENCOUNTER — Other Ambulatory Visit: Payer: Self-pay

## 2023-08-06 MED ORDER — LISDEXAMFETAMINE DIMESYLATE 40 MG PO CHEW
40.0000 mg | CHEWABLE_TABLET | Freq: Every morning | ORAL | 0 refills | Status: DC
  Filled 2023-08-06: qty 30, 30d supply, fill #0

## 2023-08-14 DIAGNOSIS — G4733 Obstructive sleep apnea (adult) (pediatric): Secondary | ICD-10-CM | POA: Diagnosis not present

## 2023-08-17 ENCOUNTER — Telehealth (INDEPENDENT_AMBULATORY_CARE_PROVIDER_SITE_OTHER): Payer: 59 | Admitting: Pulmonary Disease

## 2023-08-17 DIAGNOSIS — G4733 Obstructive sleep apnea (adult) (pediatric): Secondary | ICD-10-CM

## 2023-08-17 NOTE — Patient Instructions (Signed)
Will see you back a year from now  Continue using your CPAP  Contact us if you need any changes to your CPAP settings  Your download shows it is working well at present

## 2023-08-17 NOTE — Progress Notes (Signed)
Virtual Visit via Video Note  I connected with Charles David on 08/17/23 at 10:45 AM EST by a video enabled telemedicine application and verified that I am speaking with the correct person using two identifiers.  Location: Patient: Home Provider: Office, [phone] [place]   I discussed the limitations of evaluation and management by telemedicine and the availability of in person appointments. The patient expressed understanding and agreed to proceed.  History of Present Illness: History of mild obstructive sleep apnea Has been tolerating CPAP well  Tries to use CPAP nightly  Waking up feeling like he is at a good nights rest  No significant concerns with the mask, no significant concerns with the pressure Overall benefiting from CPAP use   Observations/Objective: Looks well, appears comfortable  Download from CPAP shows 90% use, AutoSet 5-15 95 percentile pressure of 6.3, maximum pressure of 9.2, residual AHI of 0.7  Assessment and Plan: Adequately treated sleep disordered breathing  Tolerating machine well, benefiting from machine  Has no concerns today   Follow Up Instructions: Follow-up a year from now  Call us with significant concerns   I discussed the assessment and treatment plan with the patient. The patient was provided an opportunity to ask questions and all were answered. The patient agreed with the plan and demonstrated an understanding of the instructions.   The patient was advised to call back or seek an in-person evaluation if the symptoms worsen or if the condition fails to improve as anticipated.  I provided 13 minutes of non-face-to-face time during this encounter.   Tomma Lightning, MD

## 2023-08-18 ENCOUNTER — Other Ambulatory Visit (HOSPITAL_COMMUNITY): Payer: Self-pay

## 2023-08-28 DIAGNOSIS — G4733 Obstructive sleep apnea (adult) (pediatric): Secondary | ICD-10-CM | POA: Diagnosis not present

## 2023-08-31 ENCOUNTER — Ambulatory Visit (INDEPENDENT_AMBULATORY_CARE_PROVIDER_SITE_OTHER): Payer: 59 | Admitting: Family Medicine

## 2023-08-31 ENCOUNTER — Encounter: Payer: Self-pay | Admitting: Family Medicine

## 2023-08-31 VITALS — BP 136/74 | HR 72 | Temp 97.7°F | Ht 73.0 in | Wt 242.6 lb

## 2023-08-31 DIAGNOSIS — Z9884 Bariatric surgery status: Secondary | ICD-10-CM | POA: Diagnosis not present

## 2023-08-31 DIAGNOSIS — Z0000 Encounter for general adult medical examination without abnormal findings: Secondary | ICD-10-CM

## 2023-08-31 DIAGNOSIS — E669 Obesity, unspecified: Secondary | ICD-10-CM

## 2023-08-31 DIAGNOSIS — Z13220 Encounter for screening for lipoid disorders: Secondary | ICD-10-CM | POA: Diagnosis not present

## 2023-08-31 DIAGNOSIS — Z131 Encounter for screening for diabetes mellitus: Secondary | ICD-10-CM

## 2023-08-31 NOTE — Progress Notes (Signed)
 Phone: [phone]   Subjective:  Patient presents today for their annual physical. Chief complaint-noted.   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    Priority: Medium    Hyperglycemia 05/24/2015    Priority: Medium    Fatty liver     Priority: Medium    OSA (obstructive sleep apnea) 01/01/2015    Priority: Medium    Nausea without vomiting 05/24/2015    Priority: Low   GERD (gastroesophageal reflux disease) 05/24/2015    Priority: Low   Family history of colon cancer 05/24/2015    Priority: Low   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 10/11/2019   DOE (dyspnea on exertion) 12/02/2015   Past Surgical History:  Procedure Laterality Date   broken nose     ct guided biopsy     cystic lesion- nondiagnostic but followed radiographically   LAPAROSCOPIC GASTRIC SLEEVE RESECTION N/A 10/11/2019   Procedure: LAPAROSCOPIC GASTRIC SLEEVE RESECTION WITH HIATAL HERNIA REPAIR, Upper Endo, ERAS Pathway;  Surgeon: Jacolyn Matar, MD;  Location: [facility];  Service: General;  Laterality: N/A;    Family History  Problem Relation Age of Onset   Diabetes Mother        68s   Hypertension Mother    CVA Mother        16   Colon cancer Father        mid 68s   Liver cancer Father        decesed- age 41. NASH etiology   Diabetes Father        50s   Hypertension Father     Medications- reviewed and updated Current Outpatient Medications  Medication Sig Dispense Refill   Lisdexamfetamine Dimesylate  (VYVANSE ) 40 MG CHEW Chew 1 tablet (40 mg total) by mouth in the morning. 30  tablet 0   Multiple Vitamin (MULTIVITAMIN) capsule Take 1 capsule by mouth daily.     tirzepatide  (ZEPBOUND ) 15 MG/0.5ML Pen Inject 15 mg into the skin once a week. 2 mL 3   No current facility-administered medications for this visit.    Allergies-reviewed and updated No Known Allergies  Social History   Social History Narrative   Married. 3 children 6.5 Iylah (sounds like isla)  and 41.50 years old J[place] and 3 Naya in 2020.    Wife Magda is my patient.       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 Oropharynx normal Neck: no thyromegaly CV: RRR no murmurs rubs or gallops Lungs: CTAB no crackles,  wheeze, rhonchi Abdomen: soft/nontender/nondistended/normal bowel sounds. No rebound or guarding.  Ext: no edema Skin: warm, dry Neuro: grossly normal, moves all extremities, PERRLA Declines rectal and genitourinary exam   Assessment and Plan  48 y.o. male presenting for annual physical.  Health Maintenance counseling: 1. Anticipatory guidance: Patient counseled regarding regular dental exams -q6 months, eye exams - regular- see below,  avoiding smoking and second hand smoke , limiting alcohol to 2 beverages per day - none due to his faith, no illicit drugs .   2. Risk factor reduction:  Advised patient of need for regular exercise and diet rich and fruits and vegetables to reduce risk of heart attack and stroke.  Exercise- feels he could improve on this.  Diet/weight management-significant weight loss after his gastric sleeve-he was considering going to [place] for  ongoing counseling.  Currently down 17 pounds from the last visit I saw him- Zepbound  started since that time and vyvanse  (started that after had some rebound weight gain off zebpound short term with his eye issue).  Wt Readings from Last 3 Encounters:  08/31/23 242 lb 9.6 oz (110 kg)  04/17/23 252 lb 1.6 oz (114.4 kg)  06/02/22 259 lb 12.8 oz (117.8 kg)  3. Immunizations/screenings/ancillary studies-holding off on COVID-19 vaccination but otherwise up-to-date Immunization History  Administered Date(s) Administered   Influenza Nasal 04/15/2022   Influenza,inj,Quad PF,6+ Mos 05/22/2019   Influenza-Unspecified 04/30/2015, 05/12/2020, 04/15/2021, 04/10/2023, 04/10/2023   PFIZER(Purple Top)SARS-COV-2 Vaccination 07/08/2019, 07/26/2019, 03/29/2020   Tdap 05/23/2012, 10/23/2020   4. Prostate cancer screening-  no family history, start at age 48   5. Colon cancer screening - follows colon cancer in 50s.  He had a colonoscopy 09/15/2014 and had been recalled and had another colonoscopy in 2022 with plan for 5-year repeat with Dr. Tova Fresh  6. Skin cancer screening- no dermatologist. advised regular sunscreen use. Denies worrisome, changing, or new skin lesions.  7. Smoking associated screening (lung cancer screening, AAA screen 65-75, UA)- never smoker 8. 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 Overlap with diabetes and glp1s but was able to restart later. MRI brain and orbits reassuring  # OSA-patient on CPAP again.    #Lytic bone lesion/bone cyst noted at right superior iliac spine (asymptomatic and stable from 20 19-20 22)-saw Dr.  Author Board of orthopedic surgery with [facility] most recently 08/12/2021-no further follow-up was indicated   # Screening hyperlipidemia-CT calcium scoring of 0 on 11/06/2020 so unlikely to start statin and focus on weight loss  Lab Results  Component Value Date   CHOL 169 06/02/2022   HDL 43.90 06/02/2022   LDLCALC 106 (H) 06/02/2022   TRIG 96.0 06/02/2022   CHOLHDL 4 06/02/2022   # Fatty liver-LFTs improved with weight loss-check today   Recommended follow up: Return in about 1 year (around 08/30/2024) for physical or sooner if needed.Schedule b4 you leave.  Lab/Order associations: NOT fasting   ICD-10-CM   1. Preventative health care  Z00.00     2. Screening for diabetes mellitus  Z13.1 Hemoglobin A1c    3. Obesity (BMI 30-39.9)  E66.9 Hemoglobin A1c    4. Screening for  hyperlipidemia  Z13.220 Comprehensive metabolic panel    Lipid panel    5. Bariatric surgery status  Z98.84 CBC with Differential/Platelet    Vitamin B1    Vitamin B12    VITAMIN D  25 Hydroxy (Vit-D Deficiency, Fractures)    PTH, intact (no Ca)    IBC + Ferritin      No orders of the defined types were placed in this encounter.   Return precautions advised.  Clarisa Crooked, MD

## 2023-08-31 NOTE — Patient Instructions (Addendum)
 Please stop by lab before you go If you have mychart- we will send your results within 3 business days of us  receiving them.  If you do not have mychart- we will call you about results within 5 business days of us  receiving them.  *please also note that you will see labs on mychart as soon as they post. I will later go in and write notes on them- will say "notes from Dr. Arlene Ben"   Keep an eye on the right neck- if you want referral to dermatology - let me know  Recommended follow up: Return in about 1 year (around 08/30/2024) for physical or sooner if needed.Schedule b4 you leave.

## 2023-09-01 ENCOUNTER — Encounter: Payer: Self-pay | Admitting: Family Medicine

## 2023-09-01 LAB — COMPREHENSIVE METABOLIC PANEL
ALT: 17 U/L (ref 0–53)
AST: 18 U/L (ref 0–37)
Albumin: 4.9 g/dL (ref 3.5–5.2)
Alkaline Phosphatase: 51 U/L (ref 39–117)
BUN: 15 mg/dL (ref 6–23)
CO2: 29 meq/L (ref 19–32)
Calcium: 9.8 mg/dL (ref 8.4–10.5)
Chloride: 102 meq/L (ref 96–112)
Creatinine, Ser: 0.74 mg/dL (ref 0.40–1.50)
GFR: 106.57 mL/min (ref >60.00)
Glucose, Bld: 77 mg/dL (ref 70–99)
Potassium: 4.2 meq/L (ref 3.5–5.1)
Sodium: 140 meq/L (ref 135–145)
Total Bilirubin: 0.9 mg/dL (ref 0.2–1.2)
Total Protein: 7.7 g/dL (ref 6.0–8.3)

## 2023-09-01 LAB — CBC WITH DIFFERENTIAL/PLATELET
Basophils Absolute: 0.1 10*3/uL (ref 0.0–0.1)
Basophils Relative: 1.2 % (ref 0.0–3.0)
Eosinophils Absolute: 0.1 10*3/uL (ref 0.0–0.7)
Eosinophils Relative: 1.1 % (ref 0.0–5.0)
HCT: 43.1 % (ref 39.0–52.0)
Hemoglobin: 14.9 g/dL (ref 13.0–17.0)
Lymphocytes Relative: 34.9 % (ref 12.0–46.0)
Lymphs Abs: 3.1 10*3/uL (ref 0.7–4.0)
MCHC: 34.6 g/dL (ref 30.0–36.0)
MCV: 87.2 fL (ref 78.0–100.0)
Monocytes Absolute: 0.5 10*3/uL (ref 0.1–1.0)
Monocytes Relative: 5.5 % (ref 3.0–12.0)
Neutro Abs: 5 10*3/uL (ref 1.4–7.7)
Neutrophils Relative %: 57.3 % (ref 43.0–77.0)
Platelets: 230 10*3/uL (ref 150.0–400.0)
RBC: 4.94 Mil/uL (ref 4.22–5.81)
RDW: 13 % (ref 11.5–15.5)
WBC: 8.7 10*3/uL (ref 4.0–10.5)

## 2023-09-01 LAB — LIPID PANEL
Cholesterol: 148 mg/dL (ref 0–200)
HDL: 38 mg/dL — ABNORMAL LOW (ref >39.00)
LDL Cholesterol: 94 mg/dL (ref 0–99)
NonHDL: 109.87
Total CHOL/HDL Ratio: 4
Triglycerides: 79 mg/dL (ref 0.0–149.0)
VLDL: 15.8 mg/dL (ref 0.0–40.0)

## 2023-09-01 LAB — IBC + FERRITIN
Ferritin: 129.1 ng/mL (ref 22.0–322.0)
Iron: 60 ug/dL (ref 42–165)
Saturation Ratios: 19.8 % — ABNORMAL LOW (ref 20.0–50.0)
TIBC: 302.4 ug/dL (ref 250.0–450.0)
Transferrin: 216 mg/dL (ref 212.0–360.0)

## 2023-09-01 LAB — VITAMIN D 25 HYDROXY (VIT D DEFICIENCY, FRACTURES): VITD: 55.51 ng/mL (ref 30.00–100.00)

## 2023-09-01 LAB — HEMOGLOBIN A1C: Hgb A1c MFr Bld: 5 % (ref 4.6–6.5)

## 2023-09-01 LAB — VITAMIN B12: Vitamin B-12: 554 pg/mL (ref 211–911)

## 2023-09-03 ENCOUNTER — Other Ambulatory Visit (HOSPITAL_COMMUNITY): Payer: Self-pay

## 2023-09-03 MED ORDER — ZEPBOUND 15 MG/0.5ML ~~LOC~~ SOAJ
15.0000 mg | SUBCUTANEOUS | 5 refills | Status: AC
Start: 2023-09-02 — End: ?
  Filled 2023-09-24: qty 2, 28d supply, fill #0
  Filled 2023-11-20: qty 2, 28d supply, fill #1
  Filled 2023-12-21: qty 2, 28d supply, fill #2
  Filled 2024-01-19: qty 2, 28d supply, fill #3
  Filled 2024-03-15: qty 2, 28d supply, fill #4

## 2023-09-03 MED ORDER — LISDEXAMFETAMINE DIMESYLATE 40 MG PO CHEW
40.0000 mg | CHEWABLE_TABLET | Freq: Every morning | ORAL | 0 refills | Status: DC
  Filled 2023-09-04 – 2023-09-24 (×2): qty 30, 30d supply, fill #0

## 2023-09-04 ENCOUNTER — Other Ambulatory Visit: Payer: Self-pay

## 2023-09-04 ENCOUNTER — Other Ambulatory Visit (HOSPITAL_COMMUNITY): Payer: Self-pay

## 2023-09-04 LAB — PARATHYROID HORMONE, INTACT (NO CA): PTH: 36 pg/mL (ref 16–77)

## 2023-09-04 LAB — VITAMIN B1: Vitamin B1 (Thiamine): 24 nmol/L (ref 8–30)

## 2023-09-14 DIAGNOSIS — G4733 Obstructive sleep apnea (adult) (pediatric): Secondary | ICD-10-CM | POA: Diagnosis not present

## 2023-09-16 ENCOUNTER — Other Ambulatory Visit (HOSPITAL_COMMUNITY): Payer: Self-pay

## 2023-09-24 ENCOUNTER — Other Ambulatory Visit (HOSPITAL_COMMUNITY): Payer: Self-pay

## 2023-10-12 DIAGNOSIS — G4733 Obstructive sleep apnea (adult) (pediatric): Secondary | ICD-10-CM | POA: Diagnosis not present

## 2023-10-23 ENCOUNTER — Other Ambulatory Visit (HOSPITAL_COMMUNITY): Payer: Self-pay

## 2023-11-11 ENCOUNTER — Other Ambulatory Visit (HOSPITAL_COMMUNITY): Payer: Self-pay

## 2023-11-11 MED ORDER — LISDEXAMFETAMINE DIMESYLATE 40 MG PO CHEW
40.0000 mg | CHEWABLE_TABLET | Freq: Every morning | ORAL | 0 refills | Status: DC
  Filled 2023-11-11: qty 30, 30d supply, fill #0

## 2023-11-12 DIAGNOSIS — G4733 Obstructive sleep apnea (adult) (pediatric): Secondary | ICD-10-CM | POA: Diagnosis not present

## 2023-11-20 ENCOUNTER — Other Ambulatory Visit (HOSPITAL_COMMUNITY): Payer: Self-pay

## 2023-11-30 DIAGNOSIS — Z6831 Body mass index (BMI) 31.0-31.9, adult: Secondary | ICD-10-CM | POA: Diagnosis not present

## 2023-11-30 DIAGNOSIS — E6609 Other obesity due to excess calories: Secondary | ICD-10-CM | POA: Diagnosis not present

## 2023-11-30 DIAGNOSIS — F50819 Binge eating disorder, unspecified: Secondary | ICD-10-CM | POA: Diagnosis not present

## 2023-11-30 DIAGNOSIS — K219 Gastro-esophageal reflux disease without esophagitis: Secondary | ICD-10-CM | POA: Diagnosis not present

## 2023-11-30 DIAGNOSIS — G4733 Obstructive sleep apnea (adult) (pediatric): Secondary | ICD-10-CM | POA: Diagnosis not present

## 2023-11-30 DIAGNOSIS — Z9189 Other specified personal risk factors, not elsewhere classified: Secondary | ICD-10-CM | POA: Diagnosis not present

## 2023-11-30 DIAGNOSIS — Z903 Acquired absence of stomach [part of]: Secondary | ICD-10-CM | POA: Diagnosis not present

## 2023-12-12 DIAGNOSIS — G4733 Obstructive sleep apnea (adult) (pediatric): Secondary | ICD-10-CM | POA: Diagnosis not present

## 2023-12-21 ENCOUNTER — Other Ambulatory Visit (HOSPITAL_COMMUNITY): Payer: Self-pay

## 2024-01-04 ENCOUNTER — Other Ambulatory Visit (HOSPITAL_COMMUNITY): Payer: Self-pay

## 2024-01-04 MED ORDER — LISDEXAMFETAMINE DIMESYLATE 40 MG PO CHEW
40.0000 mg | CHEWABLE_TABLET | Freq: Every morning | ORAL | 0 refills | Status: DC
  Filled 2024-01-04: qty 30, 30d supply, fill #0

## 2024-01-05 ENCOUNTER — Other Ambulatory Visit (HOSPITAL_COMMUNITY): Payer: Self-pay

## 2024-01-12 DIAGNOSIS — G4733 Obstructive sleep apnea (adult) (pediatric): Secondary | ICD-10-CM | POA: Diagnosis not present

## 2024-01-19 ENCOUNTER — Other Ambulatory Visit (HOSPITAL_COMMUNITY): Payer: Self-pay

## 2024-02-02 ENCOUNTER — Other Ambulatory Visit (HOSPITAL_COMMUNITY): Payer: Self-pay

## 2024-02-02 MED ORDER — LISDEXAMFETAMINE DIMESYLATE 40 MG PO CHEW
40.0000 mg | CHEWABLE_TABLET | Freq: Every morning | ORAL | 0 refills | Status: DC
  Filled 2024-02-02: qty 30, 30d supply, fill #0

## 2024-02-11 DIAGNOSIS — G4733 Obstructive sleep apnea (adult) (pediatric): Secondary | ICD-10-CM | POA: Diagnosis not present

## 2024-03-15 ENCOUNTER — Other Ambulatory Visit (HOSPITAL_COMMUNITY): Payer: Self-pay

## 2024-03-15 MED ORDER — LISDEXAMFETAMINE DIMESYLATE 40 MG PO CHEW
40.0000 mg | CHEWABLE_TABLET | Freq: Every morning | ORAL | 0 refills | Status: DC
  Filled 2024-03-15: qty 30, 30d supply, fill #0

## 2024-03-17 ENCOUNTER — Other Ambulatory Visit (HOSPITAL_COMMUNITY): Payer: Self-pay

## 2024-04-04 ENCOUNTER — Other Ambulatory Visit (HOSPITAL_COMMUNITY): Payer: Self-pay

## 2024-04-04 DIAGNOSIS — Z6832 Body mass index (BMI) 32.0-32.9, adult: Secondary | ICD-10-CM | POA: Diagnosis not present

## 2024-04-04 DIAGNOSIS — K219 Gastro-esophageal reflux disease without esophagitis: Secondary | ICD-10-CM | POA: Diagnosis not present

## 2024-04-04 DIAGNOSIS — Z9189 Other specified personal risk factors, not elsewhere classified: Secondary | ICD-10-CM | POA: Diagnosis not present

## 2024-04-04 DIAGNOSIS — F50819 Binge eating disorder, unspecified: Secondary | ICD-10-CM | POA: Diagnosis not present

## 2024-04-04 DIAGNOSIS — E6609 Other obesity due to excess calories: Secondary | ICD-10-CM | POA: Diagnosis not present

## 2024-04-04 DIAGNOSIS — Z903 Acquired absence of stomach [part of]: Secondary | ICD-10-CM | POA: Diagnosis not present

## 2024-04-04 DIAGNOSIS — G4733 Obstructive sleep apnea (adult) (pediatric): Secondary | ICD-10-CM | POA: Diagnosis not present

## 2024-04-04 MED ORDER — ZEPBOUND 15 MG/0.5ML ~~LOC~~ SOAJ
15.0000 mg | SUBCUTANEOUS | 5 refills | Status: AC
  Filled 2024-04-04: qty 2, 28d supply, fill #0

## 2024-04-04 MED ORDER — LISDEXAMFETAMINE DIMESYLATE 40 MG PO CHEW
40.0000 mg | CHEWABLE_TABLET | Freq: Every morning | ORAL | 0 refills | Status: DC
  Filled 2024-04-14: qty 30, 30d supply, fill #0

## 2024-04-11 ENCOUNTER — Other Ambulatory Visit (HOSPITAL_COMMUNITY): Payer: Self-pay

## 2024-04-14 ENCOUNTER — Other Ambulatory Visit (HOSPITAL_COMMUNITY): Payer: Self-pay

## 2024-05-10 ENCOUNTER — Other Ambulatory Visit (HOSPITAL_COMMUNITY): Payer: Self-pay

## 2024-05-10 MED ORDER — LISDEXAMFETAMINE DIMESYLATE 40 MG PO CHEW
40.0000 mg | CHEWABLE_TABLET | Freq: Every morning | ORAL | 0 refills | Status: DC
  Filled 2024-05-13: qty 30, 30d supply, fill #0

## 2024-05-12 ENCOUNTER — Other Ambulatory Visit (HOSPITAL_COMMUNITY): Payer: Self-pay

## 2024-05-13 ENCOUNTER — Other Ambulatory Visit (HOSPITAL_COMMUNITY): Payer: Self-pay

## 2024-06-14 ENCOUNTER — Other Ambulatory Visit (HOSPITAL_COMMUNITY): Payer: Self-pay

## 2024-06-14 MED ORDER — LISDEXAMFETAMINE DIMESYLATE 40 MG PO CHEW
40.0000 mg | CHEWABLE_TABLET | Freq: Every day | ORAL | 0 refills | Status: AC
  Filled 2024-06-14: qty 60, 60d supply, fill #0

## 2024-09-12 ENCOUNTER — Encounter: Payer: 59 | Admitting: Family Medicine

## 2024-11-14 ENCOUNTER — Encounter: Admitting: Family Medicine
